# Patient Record
Sex: Male | Born: 1980 | Race: White | Hispanic: No | Marital: Married | Smoking: Current every day smoker
Health system: Southern US, Community
[De-identification: ages and names within clinical notes are randomized; demographics above are authoritative.]

## PROBLEM LIST (undated history)

## (undated) DIAGNOSIS — N4400 Torsion of testis, unspecified: Secondary | ICD-10-CM

## (undated) DIAGNOSIS — F1010 Alcohol abuse, uncomplicated: Secondary | ICD-10-CM

## (undated) HISTORY — PX: OTHER SURGICAL HISTORY: SHX169

## (undated) HISTORY — PX: SURGERY SCROTAL / TESTICULAR: SUR1316

---
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
## 2002-05-24 ENCOUNTER — Emergency Department (HOSPITAL_COMMUNITY): Admission: EM | Admit: 2002-05-24 | Discharge: 2002-05-24 | Payer: Self-pay | Admitting: Emergency Medicine

## 2002-05-24 ENCOUNTER — Encounter: Payer: Self-pay | Admitting: Emergency Medicine

## 2002-08-23 ENCOUNTER — Emergency Department (HOSPITAL_COMMUNITY): Admission: EM | Admit: 2002-08-23 | Discharge: 2002-08-23 | Payer: Self-pay | Admitting: Emergency Medicine

## 2002-09-03 ENCOUNTER — Emergency Department (HOSPITAL_COMMUNITY): Admission: EM | Admit: 2002-09-03 | Discharge: 2002-09-03 | Payer: Self-pay | Admitting: Emergency Medicine

## 2002-11-16 ENCOUNTER — Emergency Department (HOSPITAL_COMMUNITY): Admission: EM | Admit: 2002-11-16 | Discharge: 2002-11-16 | Payer: Self-pay | Admitting: Emergency Medicine

## 2002-11-16 ENCOUNTER — Encounter: Payer: Self-pay | Admitting: Emergency Medicine

## 2003-05-21 ENCOUNTER — Emergency Department (HOSPITAL_COMMUNITY): Admission: EM | Admit: 2003-05-21 | Discharge: 2003-05-21 | Payer: Self-pay | Admitting: Emergency Medicine

## 2003-06-03 ENCOUNTER — Emergency Department (HOSPITAL_COMMUNITY): Admission: EM | Admit: 2003-06-03 | Discharge: 2003-06-03 | Payer: Self-pay | Admitting: Emergency Medicine

## 2003-07-01 ENCOUNTER — Emergency Department (HOSPITAL_COMMUNITY): Admission: EM | Admit: 2003-07-01 | Discharge: 2003-07-02 | Payer: Self-pay | Admitting: Emergency Medicine

## 2003-08-29 ENCOUNTER — Emergency Department (HOSPITAL_COMMUNITY): Admission: EM | Admit: 2003-08-29 | Discharge: 2003-08-29 | Payer: Self-pay | Admitting: Emergency Medicine

## 2003-09-19 ENCOUNTER — Inpatient Hospital Stay (HOSPITAL_COMMUNITY): Admission: AC | Admit: 2003-09-19 | Discharge: 2003-09-21 | Payer: Self-pay

## 2003-09-27 ENCOUNTER — Emergency Department (HOSPITAL_COMMUNITY): Admission: EM | Admit: 2003-09-27 | Discharge: 2003-09-27 | Payer: Self-pay | Admitting: Emergency Medicine

## 2003-10-08 ENCOUNTER — Emergency Department (HOSPITAL_COMMUNITY): Admission: EM | Admit: 2003-10-08 | Discharge: 2003-10-08 | Payer: Self-pay | Admitting: Emergency Medicine

## 2004-01-08 ENCOUNTER — Emergency Department (HOSPITAL_COMMUNITY): Admission: EM | Admit: 2004-01-08 | Discharge: 2004-01-08 | Payer: Self-pay | Admitting: Emergency Medicine

## 2004-01-12 ENCOUNTER — Emergency Department (HOSPITAL_COMMUNITY): Admission: EM | Admit: 2004-01-12 | Discharge: 2004-01-12 | Payer: Self-pay | Admitting: Emergency Medicine

## 2004-02-23 ENCOUNTER — Emergency Department (HOSPITAL_COMMUNITY): Admission: EM | Admit: 2004-02-23 | Discharge: 2004-02-23 | Payer: Self-pay | Admitting: Emergency Medicine

## 2004-03-14 ENCOUNTER — Emergency Department (HOSPITAL_COMMUNITY): Admission: EM | Admit: 2004-03-14 | Discharge: 2004-03-14 | Payer: Self-pay | Admitting: Emergency Medicine

## 2004-06-03 ENCOUNTER — Emergency Department (HOSPITAL_COMMUNITY): Admission: EM | Admit: 2004-06-03 | Discharge: 2004-06-03 | Payer: Self-pay | Admitting: Emergency Medicine

## 2005-05-27 ENCOUNTER — Emergency Department (HOSPITAL_COMMUNITY): Admission: EM | Admit: 2005-05-27 | Discharge: 2005-05-28 | Payer: Self-pay | Admitting: Emergency Medicine

## 2005-05-30 ENCOUNTER — Emergency Department (HOSPITAL_COMMUNITY): Admission: EM | Admit: 2005-05-30 | Discharge: 2005-05-30 | Payer: Self-pay | Admitting: Emergency Medicine

## 2005-12-11 ENCOUNTER — Emergency Department (HOSPITAL_COMMUNITY): Admission: EM | Admit: 2005-12-11 | Discharge: 2005-12-12 | Payer: Self-pay | Admitting: Emergency Medicine

## 2007-04-13 ENCOUNTER — Emergency Department (HOSPITAL_COMMUNITY): Admission: EM | Admit: 2007-04-13 | Discharge: 2007-04-14 | Payer: Self-pay | Admitting: Emergency Medicine

## 2007-06-14 ENCOUNTER — Emergency Department (HOSPITAL_COMMUNITY): Admission: EM | Admit: 2007-06-14 | Discharge: 2007-06-14 | Payer: Self-pay | Admitting: Emergency Medicine

## 2007-07-17 ENCOUNTER — Emergency Department (HOSPITAL_COMMUNITY): Admission: EM | Admit: 2007-07-17 | Discharge: 2007-07-17 | Payer: Self-pay | Admitting: Emergency Medicine

## 2007-08-11 ENCOUNTER — Emergency Department (HOSPITAL_COMMUNITY): Admission: EM | Admit: 2007-08-11 | Discharge: 2007-08-11 | Payer: Self-pay | Admitting: Emergency Medicine

## 2007-09-06 ENCOUNTER — Emergency Department (HOSPITAL_COMMUNITY): Admission: EM | Admit: 2007-09-06 | Discharge: 2007-09-06 | Payer: Self-pay | Admitting: Emergency Medicine

## 2007-09-30 ENCOUNTER — Emergency Department (HOSPITAL_COMMUNITY): Admission: EM | Admit: 2007-09-30 | Discharge: 2007-09-30 | Payer: Self-pay | Admitting: Emergency Medicine

## 2007-10-26 ENCOUNTER — Emergency Department (HOSPITAL_COMMUNITY): Admission: EM | Admit: 2007-10-26 | Discharge: 2007-10-26 | Payer: Self-pay | Admitting: Emergency Medicine

## 2007-10-28 ENCOUNTER — Emergency Department (HOSPITAL_COMMUNITY): Admission: EM | Admit: 2007-10-28 | Discharge: 2007-10-29 | Payer: Self-pay | Admitting: Emergency Medicine

## 2007-11-11 ENCOUNTER — Emergency Department (HOSPITAL_COMMUNITY): Admission: EM | Admit: 2007-11-11 | Discharge: 2007-11-11 | Payer: Self-pay | Admitting: Radiation Oncology

## 2008-05-20 ENCOUNTER — Emergency Department (HOSPITAL_COMMUNITY): Admission: EM | Admit: 2008-05-20 | Discharge: 2008-05-20 | Payer: Self-pay | Admitting: Emergency Medicine

## 2008-05-27 ENCOUNTER — Emergency Department (HOSPITAL_COMMUNITY): Admission: EM | Admit: 2008-05-27 | Discharge: 2008-05-27 | Payer: Self-pay | Admitting: Emergency Medicine

## 2008-06-01 ENCOUNTER — Emergency Department (HOSPITAL_COMMUNITY): Admission: EM | Admit: 2008-06-01 | Discharge: 2008-06-01 | Payer: Self-pay | Admitting: Emergency Medicine

## 2009-11-14 IMAGING — CT CT PELVIS W/O CM
1 of 2 series · 13 of 32 positions shown, 19 images · non-contrast
Comparison: 07/17/2007

CT ABDOMEN

CLINICAL DATA: Right flank pain.  Assess for renal stone disease.

CT ABDOMEN AND PELVIS WITHOUT CONTRAST
TECHNIQUE: Multidetector CT imaging of the abdomen and pelvis was
performed following the standard
protocol without intravenous contrast.

[Series 4: 130 stone 4.0 b70f st · axial · 0.60mm/px · z∈[-631,-256]mm · 13 of 87 slices shown, 19 images]
[im 6/87  soft-tissue]
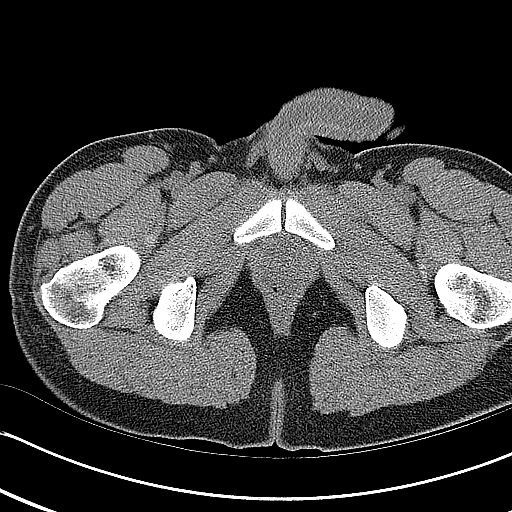
[im 6/87  bone]
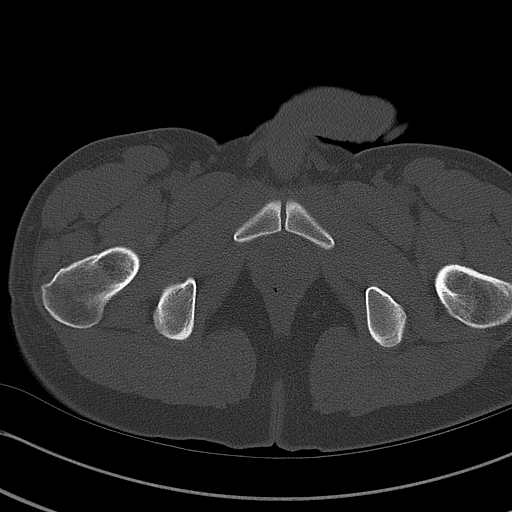
[im 12/87  soft-tissue]
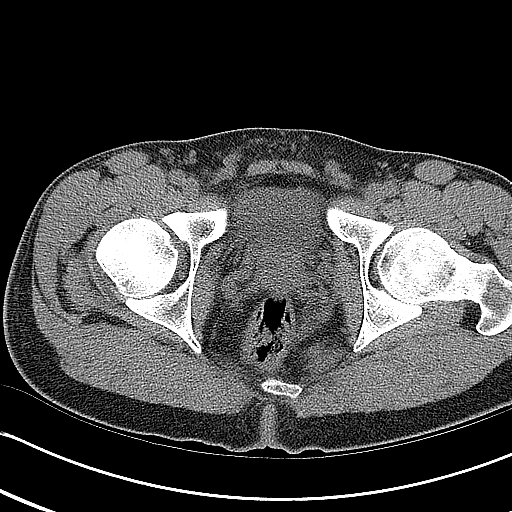
[im 18/87  soft-tissue]
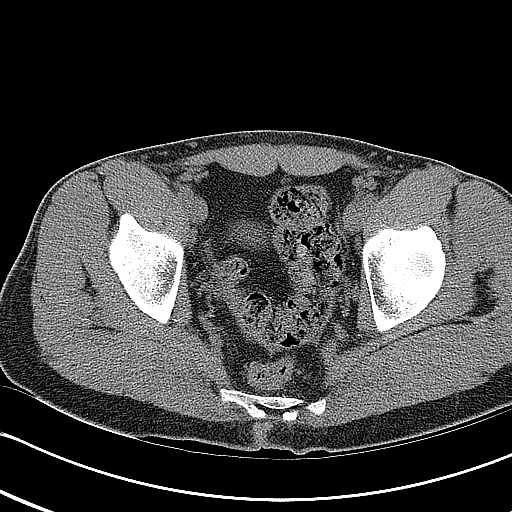
[im 23/87  soft-tissue]
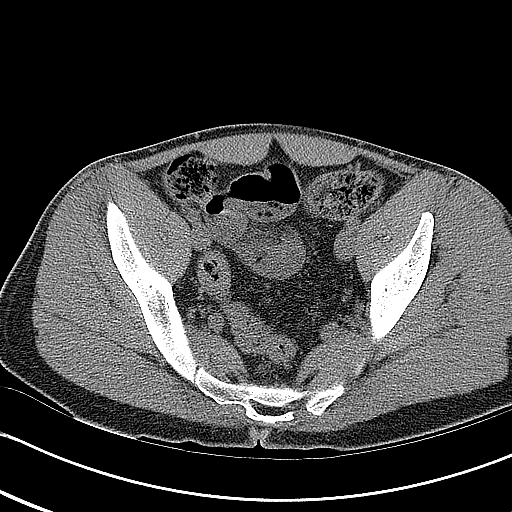
[im 29/87  soft-tissue]
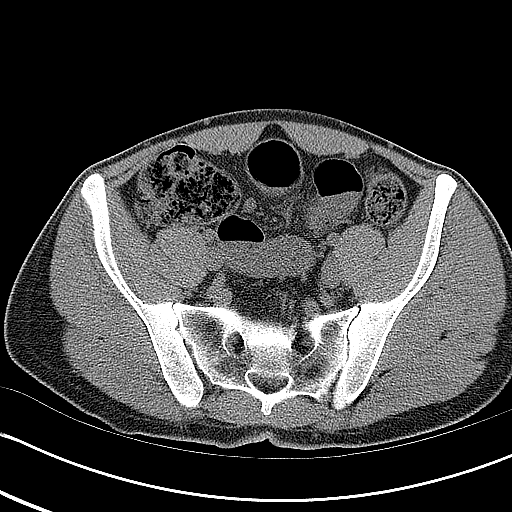
[im 35/87  soft-tissue]
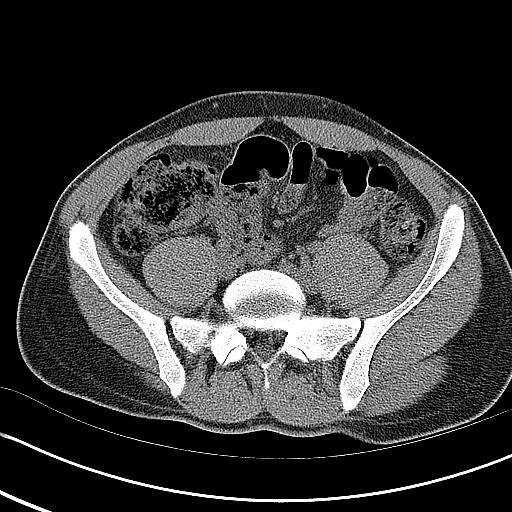
[im 46/87  soft-tissue]
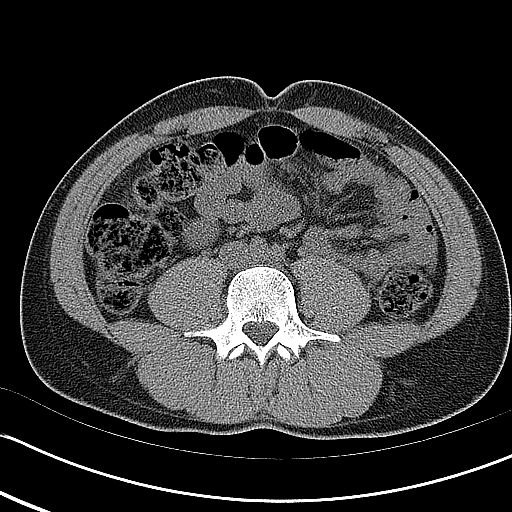
[im 52/87  soft-tissue]
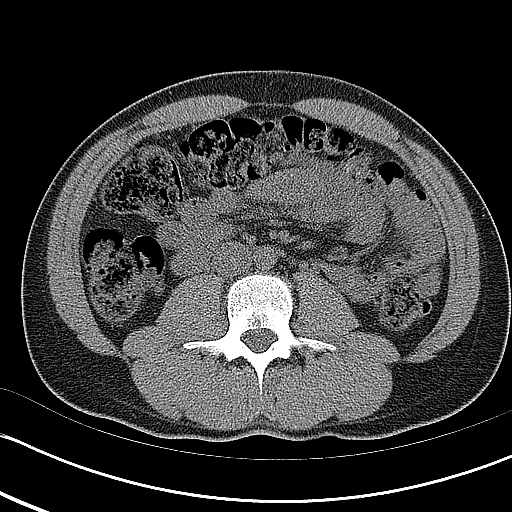
[im 58/87  soft-tissue]
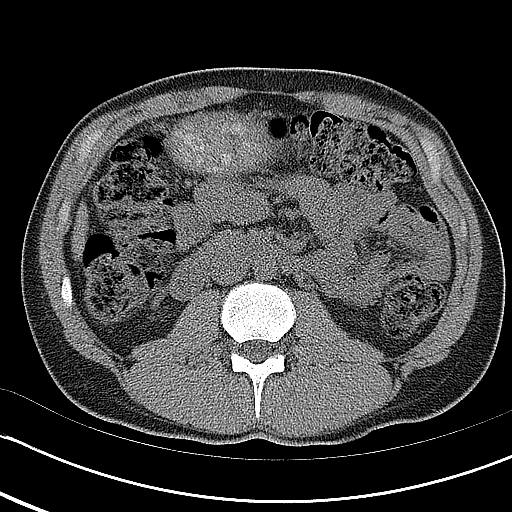
[im 58/87  bone]
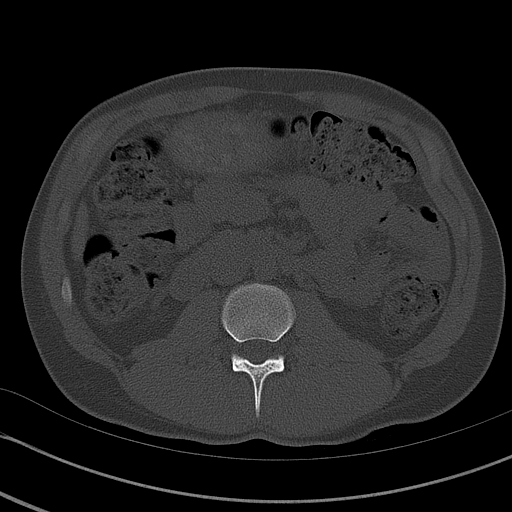
[im 64/87  soft-tissue]
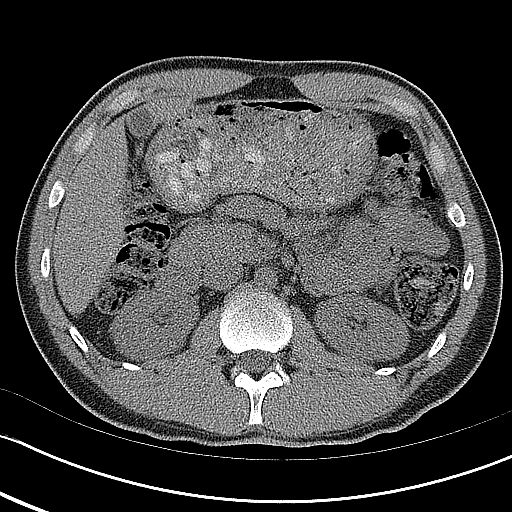
[im 64/87  lung]
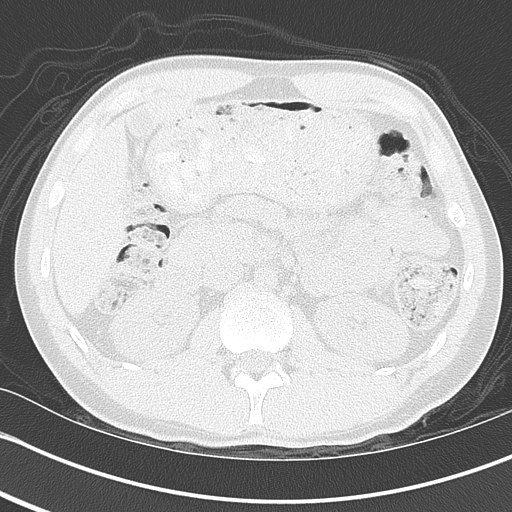
[im 69/87  soft-tissue]
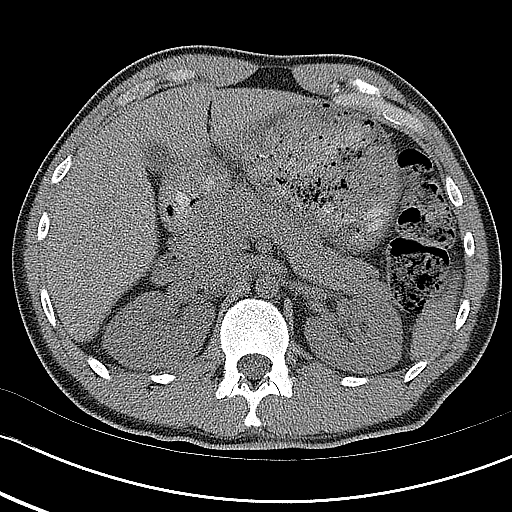
[im 69/87  lung]
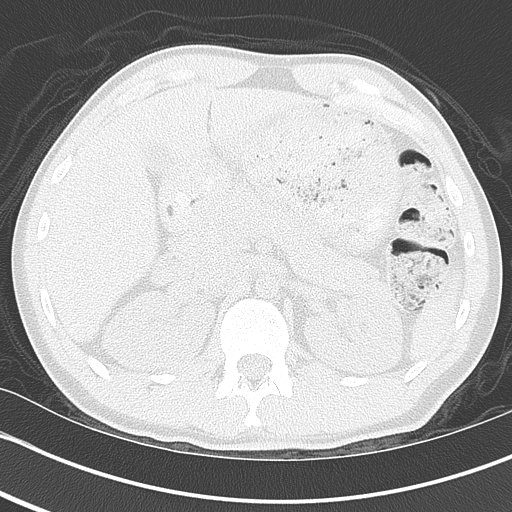
[im 75/87  soft-tissue]
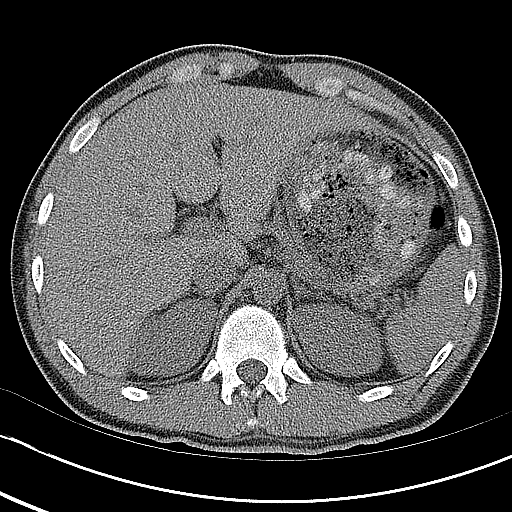
[im 75/87  lung]
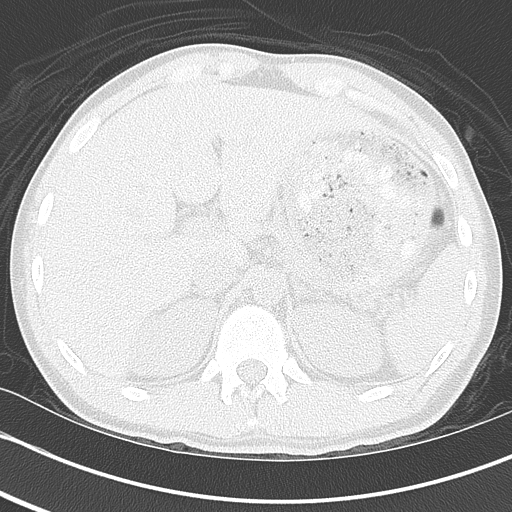
[im 81/87  soft-tissue]
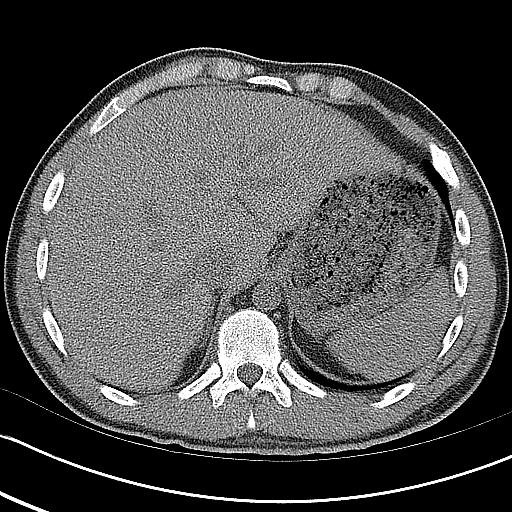
[im 81/87  lung]
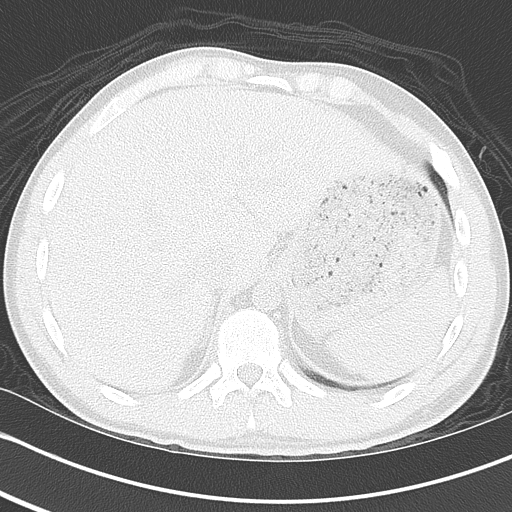

[13 of 32 positions shown; findings below may reference images not displayed]

FINDINGS: The liver has a none remarkable appearance without
contrast.  No calcified gallstones.  The spleen is normal.  The
pancreas is normal.  The adrenal glands are normal.  The kidneys
are normal in size shape and position.  No evidence of stone
disease or other pathology.  The aorta and IVC are normal.  No
retroperitoneal mass or adenopathy.  No free intraperitoneal fluid
or air.  There is a large amount of fecal matter in the colon.  No
focal bowel pathology.
IMPRESSION: No evidence of renal stone disease.

Large amount of fecal matter in the colon.

CT PELVIS
FINDINGS: No sign of urinary tract stone disease.  The bladder,
prostate gland and seminal vesicles appear unremarkable.  No mass
or adenopathy.  The appendix appears normal.  Large amount of fecal
matter is noted within the colon.
IMPRESSION: Negative except for large amount of fecal matter within the colon

## 2010-07-22 LAB — HEPATIC FUNCTION PANEL
Bilirubin, Direct: 0.3 mg/dL (ref 0.0–0.3)
Indirect Bilirubin: 0.7 mg/dL (ref 0.3–0.9)

## 2010-07-22 LAB — BASIC METABOLIC PANEL
BUN: 12 mg/dL (ref 6–23)
CO2: 27 mEq/L (ref 19–32)
Calcium: 9.8 mg/dL (ref 8.4–10.5)
Chloride: 102 mEq/L (ref 96–112)
Creatinine, Ser: 0.97 mg/dL (ref 0.4–1.5)
GFR calc Af Amer: >60 mL/min (ref >60)
GFR calc non Af Amer: >60 mL/min (ref >60)
GFR calc non Af Amer: >60 mL/min (ref >60)
Glucose, Bld: 106 mg/dL — ABNORMAL HIGH (ref 70–99)
Potassium: 3.9 mEq/L (ref 3.5–5.1)

## 2010-07-22 LAB — RAPID URINE DRUG SCREEN, HOSP PERFORMED
Amphetamines: NOT DETECTED
Amphetamines: NOT DETECTED
Barbiturates: NOT DETECTED
Benzodiazepines: NOT DETECTED
Benzodiazepines: POSITIVE — AB
Cocaine: POSITIVE — AB
Opiates: NOT DETECTED
Tetrahydrocannabinol: NOT DETECTED
Tetrahydrocannabinol: NOT DETECTED

## 2010-07-22 LAB — CBC
HCT: 43.1 % (ref 39.0–52.0)
MCHC: 35.1 g/dL (ref 30.0–36.0)
MCV: 93.2 fL (ref 78.0–100.0)
Platelets: 228 10*3/uL (ref 150–400)
RBC: 5.27 MIL/uL (ref 4.22–5.81)
RBC: 4.61 MIL/uL (ref 4.22–5.81)
RDW: 13.9 % (ref 11.5–15.5)
WBC: 10.1 10*3/uL (ref 4.0–10.5)
WBC: 6.2 10*3/uL (ref 4.0–10.5)

## 2010-07-22 LAB — DIFFERENTIAL
Basophils Relative: 1 % (ref 0–1)
Eosinophils Absolute: 0 10*3/uL (ref 0.0–0.7)
Eosinophils Relative: 1 % (ref 0–5)
Lymphocytes Relative: 22 % (ref 12–46)
Lymphs Abs: 1.4 10*3/uL (ref 0.7–4.0)
Monocytes Relative: 7 % (ref 3–12)
Neutro Abs: 7.1 10*3/uL (ref 1.7–7.7)
Neutrophils Relative %: 70 % (ref 43–77)

## 2010-07-22 LAB — COMPREHENSIVE METABOLIC PANEL
ALT: 39 U/L (ref 0–53)
AST: 62 U/L — ABNORMAL HIGH (ref 0–37)
CO2: 28 mEq/L (ref 19–32)
Calcium: 9.6 mg/dL (ref 8.4–10.5)
Chloride: 105 mEq/L (ref 96–112)
GFR calc Af Amer: >60 mL/min (ref >60)
GFR calc non Af Amer: >60 mL/min (ref >60)
Sodium: 139 mEq/L (ref 135–145)
Total Bilirubin: 0.7 mg/dL (ref 0.3–1.2)

## 2010-07-22 LAB — ETHANOL: Alcohol, Ethyl (B): 54 mg/dL — ABNORMAL HIGH (ref 0–10)
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
## 2010-08-22 NOTE — Op Note (Signed)
NAMETRUTH, BAROT                              ACCOUNT NO.:  [id number]   MEDICAL RECORD NO.:  [id number]                   PATIENT TYPE:  INP   LOCATION:  6705                                 FACILITY:  [facility]   PHYSICIAN:  Jimmye Norman III, M.D.               DATE OF BIRTH:  28-Jul-1980   DATE OF PROCEDURE:  09/19/2003  DATE OF DISCHARGE:                                 OPERATIVE REPORT   PREOPERATIVE DIAGNOSES:  Laceration of right forearm with near  exsanguination and cardiac arrest.   POSTOPERATIVE DIAGNOSIS:  Laceration of right forearm into muscle flexor  compartments of the right upper extremity below the right antecubital fossa.   PROCEDURE:  Exploration, control of hemorrhage, and repair of laceration.   SURGEON:  Jimmye Norman, M.D.   ASSISTANT:  __________, P.A. for trauma.   ANESTHESIA:  General endotracheal.   TOURNIQUET TIME:  Six minutes used intraoperatively.   COMPLICATIONS:  None.   CONDITION:  Stable.   FINDINGS:  The patient had lacerations deeply into the ulnar and radial-  sided muscle compartments in the right forearm with no obvious neurovascular  injury.  Attempt to explore for tendon injury was not done, but we did  control bleeding and repair the muscle bellies themselves.   OPERATION:  The patient was taken emergently up to the operating room for  repair after he nearly arrested from an exsanguinating hemorrhage from this  right upper extremity laceration.  Direct control of bleeding was not  attained in the ED.  The patient lost a significant amount requiring  intubation and blood transfusion.   He was taken to the operating room and placed on the table in the supine  position with an armboard, and the table was rotated.  We used a short cuff  above the antecubital fossa up near the axilla, inflated that up to 250  mmHg, and exsanguinated the hand prior to doing so.  We then unveiled the  area and prepped and draped in the usual sterile  manner.   We used an extremity drape and explored the lacerations on the ulnar and the  radial aspects of the forearm just below the antecubital fossa.  Each  laceration measured approximately 3 inches Luepke and were deep, about an inch  into the muscle compartments.   We irrigated with saline, removed all clot from the wounds prior to removing  the tourniquet.  Once we had it adequately cleaned out, we removed the  tourniquet and found that there was minimal bleeding from any significant  arterial structures, but there was a vein along the ulnar aspect of the  forearm laceration which had to be ligated with 3-0 Vicryl suture.   Once we had ligated this vein, there was very minimal arterial bleeding, but  some from the fascial edges of the cut muscle which could be cauterized and  controlled.  There were  no large neurovascular bundles noted to be lacerated  and hanging in the wound, and a deep exploration for these structures was  not performed.  We controlled the bleeding, then repaired it in three  layers, the initial one being repairing the muscle fascia while the arm was  flexed at the elbow using interrupted 2-0 Vicryl sutures.  We did not flex  it all to 90 degrees but at approximately 45 degrees or so to take the  tension off the muscle fascia repair.  Laterally, the fascia was also  repaired in a similar manner with interrupted 2-0 Vicryl suture, and again  no significant neurovascular bundles were noted to be lacerated or torn.  Once the muscle bellies were reapproximated, we used 3-0 Vicryl to  reapproximate the subcutaneous tissue laterally or along the radial aspect  and medially along the ulnar aspect.  Once the subcutaneous tissue was  closed, we used staples to close the skin with a few interrupted 4-0 nylon  sutures used to close the smaller lacerated areas.  Once this was done, we  provided sterile dressing and put on a posterior splint with the patient  flexed at the  elbow at approximately 60 degrees.  Sterile dressing was  applied including a Betadine ointment, 4 x4's, and a Kerlix, followed by  Webril and a splint.  The patient had an excellent radial pulse after the  procedure and also once the tourniquet was released which was approximately  six minutes after it had been inflated.  He was in stable condition, and he  was taken to the recovery room in stable condition where further assessment  of hand function will be obtained.                                               Kathrin Ruddy, M.D.    JW/MEDQ  D:  09/19/2003  T:  09/19/2003  Job:  [id number]
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
## 2010-12-29 LAB — COMPREHENSIVE METABOLIC PANEL
ALT: 18
AST: 27
Alkaline Phosphatase: 53
CO2: 22
Chloride: 100
Creatinine, Ser: 0.83
GFR calc Af Amer: >60
GFR calc non Af Amer: >60
Potassium: 3.8
Sodium: 133 — ABNORMAL LOW
Total Bilirubin: 0.8

## 2010-12-29 LAB — DIFFERENTIAL
Basophils Absolute: 0
Basophils Relative: 0
Eosinophils Absolute: 0
Eosinophils Relative: 0
Monocytes Absolute: 0.4

## 2010-12-29 LAB — TRICYCLICS SCREEN, URINE: TCA Scrn: NOT DETECTED

## 2010-12-29 LAB — URINALYSIS, ROUTINE W REFLEX MICROSCOPIC
Bilirubin Urine: NEGATIVE
Ketones, ur: NEGATIVE
Nitrite: NEGATIVE
Protein, ur: NEGATIVE

## 2010-12-29 LAB — RAPID URINE DRUG SCREEN, HOSP PERFORMED
Benzodiazepines: NOT DETECTED
Tetrahydrocannabinol: NOT DETECTED

## 2010-12-29 LAB — ETHANOL: Alcohol, Ethyl (B): 84 — ABNORMAL HIGH

## 2010-12-29 LAB — CBC
MCV: 91
RBC: 4.91
WBC: 7.9

## 2011-01-01 LAB — CBC
HCT: 40.9
HCT: 45.8
Hemoglobin: 14.2
MCHC: 34.8
Platelets: 173
Platelets: 223
RDW: 14.1
RDW: 13.6

## 2011-01-01 LAB — URINALYSIS, ROUTINE W REFLEX MICROSCOPIC
Bilirubin Urine: NEGATIVE
Bilirubin Urine: NEGATIVE
Hgb urine dipstick: NEGATIVE
Ketones, ur: NEGATIVE
Nitrite: NEGATIVE
Specific Gravity, Urine: 1.025
Specific Gravity, Urine: 1.021
Urobilinogen, UA: 0.2
pH: 5
pH: 6.5

## 2011-01-01 LAB — ETHANOL: Alcohol, Ethyl (B): 61 — ABNORMAL HIGH

## 2011-01-01 LAB — COMPREHENSIVE METABOLIC PANEL
ALT: 16
Albumin: 4.1
Calcium: 8.8
Glucose, Bld: 106 — ABNORMAL HIGH
Sodium: 139
Total Protein: 6.9

## 2011-01-01 LAB — ACETAMINOPHEN LEVEL: Acetaminophen (Tylenol), Serum: <10 — ABNORMAL LOW

## 2011-01-01 LAB — SALICYLATE LEVEL: Salicylate Lvl: <4

## 2011-01-01 LAB — DIFFERENTIAL
Basophils Absolute: 0
Basophils Absolute: 0
Basophils Relative: 1
Eosinophils Absolute: 0.1
Eosinophils Relative: 2
Eosinophils Relative: 0
Lymphocytes Relative: 15
Monocytes Absolute: 0.3
Neutro Abs: 6.2
Neutrophils Relative %: 77

## 2011-01-01 LAB — BASIC METABOLIC PANEL
BUN: 10
CO2: 25
Glucose, Bld: 102 — ABNORMAL HIGH
Potassium: 4.3
Sodium: 139

## 2011-01-01 LAB — RAPID URINE DRUG SCREEN, HOSP PERFORMED
Cocaine: POSITIVE — AB
Opiates: NOT DETECTED

## 2011-01-02 LAB — CBC
HCT: 43.5
Hemoglobin: 15.1
Platelets: 185
WBC: 9.7

## 2011-01-02 LAB — DIFFERENTIAL
Eosinophils Relative: 0
Lymphocytes Relative: 20
Lymphs Abs: 1.9

## 2011-01-02 LAB — URINALYSIS, ROUTINE W REFLEX MICROSCOPIC
Bilirubin Urine: NEGATIVE
Hgb urine dipstick: NEGATIVE
Specific Gravity, Urine: 1.024
pH: 6.5

## 2011-01-02 LAB — POCT I-STAT, CHEM 8
BUN: 15
Chloride: 104
Creatinine, Ser: 1.2
Sodium: 141
TCO2: 30

## 2011-01-02 LAB — RAPID URINE DRUG SCREEN, HOSP PERFORMED: Benzodiazepines: POSITIVE — AB

## 2011-01-02 LAB — URINE MICROSCOPIC-ADD ON

## 2011-01-02 LAB — ETHANOL: Alcohol, Ethyl (B): <5

## 2014-11-13 ENCOUNTER — Encounter (HOSPITAL_COMMUNITY): Payer: Self-pay | Admitting: Emergency Medicine

## 2014-11-13 ENCOUNTER — Emergency Department (HOSPITAL_COMMUNITY)
Admission: EM | Admit: 2014-11-13 | Discharge: 2014-11-16 | Disposition: A | Discharge status: Home / Self Care | Payer: Self-pay | Attending: Emergency Medicine | Admitting: Emergency Medicine

## 2014-11-13 ENCOUNTER — Emergency Department (HOSPITAL_COMMUNITY): Payer: Self-pay

## 2014-11-13 DIAGNOSIS — M79641 Pain in right hand: Secondary | ICD-10-CM

## 2014-11-13 DIAGNOSIS — Z720 Tobacco use: Secondary | ICD-10-CM | POA: Insufficient documentation

## 2014-11-13 DIAGNOSIS — Y9289 Other specified places as the place of occurrence of the external cause: Secondary | ICD-10-CM | POA: Insufficient documentation

## 2014-11-13 DIAGNOSIS — F1994 Other psychoactive substance use, unspecified with psychoactive substance-induced mood disorder: Secondary | ICD-10-CM | POA: Diagnosis present

## 2014-11-13 DIAGNOSIS — S79911A Unspecified injury of right hip, initial encounter: Secondary | ICD-10-CM | POA: Insufficient documentation

## 2014-11-13 DIAGNOSIS — F1490 Cocaine use, unspecified, uncomplicated: Secondary | ICD-10-CM | POA: Diagnosis present

## 2014-11-13 DIAGNOSIS — R45850 Homicidal ideations: Secondary | ICD-10-CM

## 2014-11-13 DIAGNOSIS — T07XXXA Unspecified multiple injuries, initial encounter: Secondary | ICD-10-CM

## 2014-11-13 DIAGNOSIS — S8992XA Unspecified injury of left lower leg, initial encounter: Secondary | ICD-10-CM | POA: Insufficient documentation

## 2014-11-13 DIAGNOSIS — M25562 Pain in left knee: Secondary | ICD-10-CM

## 2014-11-13 DIAGNOSIS — R451 Restlessness and agitation: Secondary | ICD-10-CM | POA: Insufficient documentation

## 2014-11-13 DIAGNOSIS — F1020 Alcohol dependence, uncomplicated: Secondary | ICD-10-CM | POA: Diagnosis present

## 2014-11-13 DIAGNOSIS — M25551 Pain in right hip: Secondary | ICD-10-CM

## 2014-11-13 DIAGNOSIS — S6991XA Unspecified injury of right wrist, hand and finger(s), initial encounter: Secondary | ICD-10-CM | POA: Insufficient documentation

## 2014-11-13 DIAGNOSIS — Y9389 Activity, other specified: Secondary | ICD-10-CM | POA: Insufficient documentation

## 2014-11-13 DIAGNOSIS — Y998 Other external cause status: Secondary | ICD-10-CM | POA: Insufficient documentation

## 2014-11-13 DIAGNOSIS — F39 Unspecified mood [affective] disorder: Secondary | ICD-10-CM | POA: Diagnosis present

## 2014-11-13 LAB — CBC WITH DIFFERENTIAL/PLATELET
BASOS ABS: 0 10*3/uL (ref 0.0–0.1)
Basophils Relative: 1 % (ref 0–1)
EOS PCT: 0 % (ref 0–5)
Eosinophils Absolute: 0 10*3/uL (ref 0.0–0.7)
HEMATOCRIT: 43.3 % (ref 39.0–52.0)
Hemoglobin: 14.4 g/dL (ref 13.0–17.0)
Lymphocytes Relative: 24 % (ref 12–46)
Lymphs Abs: 1.5 10*3/uL (ref 0.7–4.0)
MCH: 30.1 pg (ref 26.0–34.0)
MCHC: 33.3 g/dL (ref 30.0–36.0)
MCV: 90.4 fL (ref 78.0–100.0)
Monocytes Absolute: 0.4 10*3/uL (ref 0.1–1.0)
Monocytes Relative: 7 % (ref 3–12)
NEUTROS PCT: 68 % (ref 43–77)
Neutro Abs: 4.1 10*3/uL (ref 1.7–7.7)
Platelets: 180 10*3/uL (ref 150–400)
RBC: 4.79 MIL/uL (ref 4.22–5.81)
RDW: 15.8 % — AB (ref 11.5–15.5)
WBC: 6 10*3/uL (ref 4.0–10.5)

## 2014-11-13 LAB — COMPREHENSIVE METABOLIC PANEL
ALBUMIN: 4.5 g/dL (ref 3.5–5.0)
ALT: 23 U/L (ref 17–63)
AST: 44 U/L — ABNORMAL HIGH (ref 15–41)
Alkaline Phosphatase: 56 U/L (ref 38–126)
Anion gap: 13 (ref 5–15)
BUN: 11 mg/dL (ref 6–20)
CO2: 25 mmol/L (ref 22–32)
Calcium: 9.1 mg/dL (ref 8.9–10.3)
Chloride: 105 mmol/L (ref 101–111)
Creatinine, Ser: 1.23 mg/dL (ref 0.61–1.24)
GFR calc non Af Amer: >60 mL/min (ref >60)
Glucose, Bld: 104 mg/dL — ABNORMAL HIGH (ref 65–99)
Potassium: 3.9 mmol/L (ref 3.5–5.1)
Sodium: 143 mmol/L (ref 135–145)
TOTAL PROTEIN: 7.7 g/dL (ref 6.5–8.1)
Total Bilirubin: 0.1 mg/dL — ABNORMAL LOW (ref 0.3–1.2)

## 2014-11-13 LAB — ETHANOL: ALCOHOL ETHYL (B): 160 mg/dL — AB (ref <5)

## 2014-11-13 LAB — RAPID URINE DRUG SCREEN, HOSP PERFORMED
AMPHETAMINES: NOT DETECTED
BARBITURATES: NOT DETECTED
Benzodiazepines: POSITIVE — AB
Cocaine: POSITIVE — AB
OPIATES: NOT DETECTED
Tetrahydrocannabinol: NOT DETECTED

## 2014-11-13 MED ORDER — ONDANSETRON HCL 4 MG PO TABS: 4.0000 mg | ORAL_TABLET | Freq: Three times a day (TID) | ORAL | Status: DC | PRN

## 2014-11-13 MED ORDER — NICOTINE 21 MG/24HR TD PT24
21.0000 mg | MEDICATED_PATCH | Freq: Once | TRANSDERMAL | Status: AC
  Administered 2014-11-13: 21 mg via TRANSDERMAL
  Filled 2014-11-13: qty 1

## 2014-11-13 MED ORDER — ZOLPIDEM TARTRATE 5 MG PO TABS: 5.0000 mg | ORAL_TABLET | Freq: Every evening | ORAL | Status: DC | PRN

## 2014-11-13 MED ORDER — IBUPROFEN 200 MG PO TABS: 600.0000 mg | ORAL_TABLET | Freq: Three times a day (TID) | ORAL | Status: DC | PRN

## 2014-11-13 MED ORDER — TRAMADOL HCL 50 MG PO TABS
50.0000 mg | ORAL_TABLET | Freq: Once | ORAL | Status: AC
  Administered 2014-11-13: 50 mg via ORAL
  Filled 2014-11-13: qty 1
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
## 2014-11-13 NOTE — [facility] Counselor (Signed)
Pt referred to: [facility] [address]   [place], [place] OBS Counselor
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
## 2014-11-13 NOTE — BH Assessment (Signed)
[facility] Assessment Progress Note   Called and scheduled this pt's tele assessment with this clinician.  Called [name], PA-C to gather clinical information on the pt.  Casimer Lanius, MS, [facility] Therapeutic Triage Specialist [facility]

## 2014-11-13 NOTE — ED Notes (Signed)
Patient has one bag of belongings in locker 27. 

## 2014-11-13 NOTE — ED Notes (Signed)
Pt will not admit whom his is homicidal toward.

## 2014-11-14 DIAGNOSIS — F1020 Alcohol dependence, uncomplicated: Secondary | ICD-10-CM | POA: Diagnosis present

## 2014-11-14 DIAGNOSIS — F39 Unspecified mood [affective] disorder: Secondary | ICD-10-CM | POA: Diagnosis present

## 2014-11-14 DIAGNOSIS — F1490 Cocaine use, unspecified, uncomplicated: Secondary | ICD-10-CM | POA: Diagnosis present

## 2014-11-14 DIAGNOSIS — R45850 Homicidal ideations: Secondary | ICD-10-CM | POA: Insufficient documentation

## 2014-11-14 DIAGNOSIS — R451 Restlessness and agitation: Secondary | ICD-10-CM | POA: Insufficient documentation

## 2014-11-14 DIAGNOSIS — F1420 Cocaine dependence, uncomplicated: Secondary | ICD-10-CM

## 2014-11-14 MED ORDER — LORAZEPAM 1 MG PO TABS
1.0000 mg | ORAL_TABLET | Freq: Four times a day (QID) | ORAL | Status: DC | PRN
  Administered 2014-11-14 – 2014-11-16 (×2): 1 mg via ORAL
  Filled 2014-11-14 (×2): qty 1

## 2014-11-14 MED ORDER — HYDROXYZINE HCL 25 MG PO TABS
25.0000 mg | ORAL_TABLET | Freq: Three times a day (TID) | ORAL | Status: DC | PRN
  Administered 2014-11-14 – 2014-11-15 (×3): 25 mg via ORAL
  Filled 2014-11-14 (×2): qty 1

## 2014-11-14 MED ORDER — HALOPERIDOL 5 MG PO TABS
5.0000 mg | ORAL_TABLET | Freq: Once | ORAL | Status: AC
  Administered 2014-11-14: 5 mg via ORAL
  Filled 2014-11-14: qty 1

## 2014-11-14 MED ORDER — LORAZEPAM 1 MG PO TABS
2.0000 mg | ORAL_TABLET | Freq: Once | ORAL | Status: AC
  Administered 2014-11-14: 2 mg via ORAL
  Filled 2014-11-14: qty 2

## 2014-11-14 MED ORDER — QUETIAPINE FUMARATE 100 MG PO TABS
100.0000 mg | ORAL_TABLET | Freq: Every day | ORAL | Status: DC
  Administered 2014-11-14 – 2014-11-15 (×2): 100 mg via ORAL
  Filled 2014-11-14 (×2): qty 1

## 2014-11-14 MED ORDER — FLUOXETINE HCL 10 MG PO CAPS
10.0000 mg | ORAL_CAPSULE | Freq: Every day | ORAL | Status: DC
  Administered 2014-11-14 – 2014-11-16 (×3): 10 mg via ORAL
  Filled 2014-11-14 (×4): qty 1
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
## 2014-11-14 NOTE — ED Notes (Signed)
Brad Sievert, PA called and informed that patient is pacing, rocking and reporting that he hears voices and appears anxious at this time. Patient stated " I want something to take to make the voices go away". Telephone orders received for haldol 5 mg PO and ativan 2 mg po given verified and read back.

## 2014-11-14 NOTE — ED Notes (Signed)
Patient was witnessed on camera by writer shaking and pacing. Writer went and asked him if he was having anxiety he said he is hearing voices and he needs something to make them go away

## 2014-11-14 NOTE — Progress Notes (Signed)
Followed up on inpatient placement efforts.  Referred to: [facility]- per [place], did not receive referral last night- refax  [facility]- per Britta Mccre[facility]- per Victorino Dike  Declined: [facility] due to [facility]- per Delice Bison as pt is uninsured and would require 4,000+ deposit up-front  At capacity: [place] for [facility] beds [facility]  Ilean Skill, MSW, LCSW Clinical Social Work, Disposition  11/14/2014 [phone]

## 2014-11-14 NOTE — ED Notes (Signed)
Patient denies SI and VH at this time. Patient admits HI to people he owes drug money too and  AH. Patient calm and cooperative at this time. Plan of care discussed with patient. Patient voices no complaints at this time. Encouragement and support provided and safety maintain. Q 15 min safety checks remain in place.

## 2014-11-15 MED ORDER — QUETIAPINE FUMARATE 50 MG PO TABS
50.0000 mg | ORAL_TABLET | Freq: Two times a day (BID) | ORAL | Status: DC
  Administered 2014-11-15: 50 mg via ORAL
  Filled 2014-11-15: qty 1

## 2014-11-15 MED ORDER — QUETIAPINE FUMARATE 50 MG PO TABS
50.0000 mg | ORAL_TABLET | Freq: Two times a day (BID) | ORAL | Status: DC
  Administered 2014-11-16: 50 mg via ORAL
  Filled 2014-11-15: qty 1

## 2014-11-15 NOTE — Progress Notes (Signed)
Pt seen on 8/10/ 16 and 11/15/14 Cm noted pt to be sleepy on 11/14/14  CM spoke with pt who confirms uninsured [employer] resident with no pcp.  CM discussed and provided written information for uninsured accepting pcps, discussed the importance of pcp vs EDP services for f/u care, www.needymeds.org, www.goodrx.com, discounted pharmacies and other [employer] such as [facility] , [employer], affordable care act, financial assistance, uninsured dental services, [place] med assist, DSS and  health department  Reviewed resources for [employer] uninsured accepting pcps like [facility], family medicine at [facility], [facility], [facility], local urgent care centers, [facility], [facility], general medical clinics, family services of the [place], [facility] urgent care plus others, medication resources, [facility] out patient [facility]ies and housing Pt voiced understanding and appreciation of resources provided   Provided P4CC contact information Pt agreed to a referral Cm completed referral Pt to be contact by [facility] clinical liason

## 2014-11-15 NOTE — BH Assessment (Signed)
BHH Assessment Progress Note  The following facilities have been contacted to seek placement for this pt, with results as noted:  Beds available, information sent, decision pending:  [facility]  At capacity:  [facility]  Messages left, awaiting call back:  [facility]   Steve Youngberg, MA Triage Specialist [phone]     

## 2014-11-15 NOTE — ED Notes (Signed)
Patient awake in his room most of the day.  Complained of anxiety and homicidal ideations off and on.  He received hydroxyzine once with some relief.  He was also started on 50 mg quetiapine during the day.  He states that was helpful and he actually rested for a while after that.  States he has been tried on multiple medications throughout the years and quetiapine has always helped him.  His appetite is very good and he even asks for a second tray after most meals.

## 2014-11-15 NOTE — BHH Counselor (Signed)
Pending review for possible placement with [facility].  

## 2014-11-15 NOTE — Progress Notes (Signed)
CSW was notified by [facility] that they are missing the clinical screening document. CSW faxed the facility the requested document.   Trish Mage [phone] ED CSW 11/15/2014 5:20 PM
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
## 2014-11-15 NOTE — ED Notes (Signed)
Patient admits to Hi towards noisy patients in the hallway and AH tell him to hurt noisy people. Patient denies SI and VH at this time. Plan of care discussed with patient. Patient voices no complaints or concerns at this time. Encouragement and support provided and safety maintain. Q 15 min safety checks remain in place.

## 2014-11-15 NOTE — Consult Note (Signed)
Psychiatric Specialty Exam: Physical Exam  ROS  Blood pressure 130/70, pulse 74, temperature 98 F (36.7 C), temperature source Oral, resp. rate 18, SpO2 98 %.There is no height or weight on file to calculate BMI.  General Appearance: Casual  Eye Contact:: Good  Speech: Clear and Coherent and Normal Rate  Volume: Normal  Mood: Angry and Anxious  Affect: Congruent, Depressed and Flat  Thought Process: Coherent, Goal Directed and Intact  Orientation: Full (Time, Place, and Person)  Thought Content: WDL  Suicidal Thoughts: No  Homicidal Thoughts: Yes. with intent/plan  Memory: Immediate; Good Recent; Good Remote; Good  Judgement: Poor  Insight: Fair  Psychomotor Activity: Normal  Concentration: Good  Recall: NA  Fund of Knowledge:Fair  Language: Good  Akathisia: NA  Handed: Right  AIMS (if indicated):    Assets: Desire for Improvement  ADL's: Intact  Cognition: WNL  Sleep:             Patient is complaining of auditory hallucination with voices telling him to hurt People especially a patient chanting on the hallway.  Patient is isolative in his room, he has been medicated for anxiety.  He is waiting for an admission bed and reports feeling anxious.  He denies suicide today but reports feeling homicidal towards a noisy patient.  We will continue to monitor.  Alcohol use disorder, moderate, dependence   Plan:  Prescribe Seroquel 50 mg po bid in addition to 100 mg prescribed for bed time.  We will continue to seek inpatient hospitalization.  Dahlia Byes   PMHNP-BC Patient seen face-to-face for psychiatric evaluation, chart reviewed and case discussed with the physician extender and developed treatment plan. Reviewed the information documented and agree with the treatment plan. Thedore Mins, MD

## 2014-11-16 DIAGNOSIS — F1994 Other psychoactive substance use, unspecified with psychoactive substance-induced mood disorder: Secondary | ICD-10-CM | POA: Diagnosis present

## 2014-11-16 NOTE — BHH Suicide Risk Assessment (Signed)
Suicide Risk Assessment  Discharge Assessment   [facility] Discharge Suicide Risk Assessment   Demographic Factors:  Male and Adolescent or young adult  Total Time spent with patient: 30 minutes  Musculoskeletal: Strength & Muscle Tone: within normal limits Gait & Station: normal Patient leans: N/A  Psychiatric Specialty Exam: Physical Exam  Review of Systems  Constitutional: Negative.   HENT: Negative.   Eyes: Negative.   Respiratory: Negative.   Cardiovascular: Negative.   Gastrointestinal: Negative.   Genitourinary: Negative.   Musculoskeletal: Negative.   Skin: Negative.   Neurological: Negative.   Endo/Heme/Allergies: Negative.   Psychiatric/Behavioral: Positive for substance abuse. The patient is nervous/anxious.     Blood pressure 127/81, pulse 91, temperature 97.6 F (36.4 C), temperature source Oral, resp. rate 18, SpO2 98 %.There is no height or weight on file to calculate BMI.  General Appearance: Casual  Eye Contact::  Good  Speech:  Clear and Coherent and Normal Rate  Volume:  Normal  Mood:  Mild anxiety  Affect:  Congruent   Thought Process:  Coherent, Goal Directed and Intact  Orientation:  Full (Time, Place, and Person)  Thought Content:  WDL  Suicidal Thoughts:  No  Homicidal Thoughts:  No  Memory:  Immediate;   Good Recent;   Good Remote;   Good  Judgement:  Fair  Insight:  Fair  Psychomotor Activity:  Normal  Concentration:  Good  Recall:  NA  Fund of Knowledge:Fair  Language: Good  Akathisia:  NA  Handed:  Right  AIMS (if indicated):     Assets:  Desire for Improvement  ADL's:  Intact  Cognition: WNL  Sleep:         Has this patient used any form of tobacco in the last 30 days? (Cigarettes, Smokeless Tobacco, Cigars, and/or Pipes) Yes, A prescription for an FDA-approved tobacco cessation medication was offered at discharge and the patient refused  Mental Status Per Nursing Assessment::   On Admission:   Homicidal ideations  Current  Mental Status by Physician: NA  Loss Factors: NA  Historical Factors: NA  Risk Reduction Factors:   Positive social support  Continued Clinical Symptoms:  Mild anxiety, substance abuse  Cognitive Features That Contribute To Risk:  None    Suicide Risk:  Minimal: No identifiable suicidal ideation.  Patients presenting with no risk factors but with morbid ruminations; may be classified as minimal risk based on the severity of the depressive symptoms  Principal Problem: Substance induced mood disorder Discharge Diagnoses:  Patient Active Problem List   Diagnosis Date Noted  . Substance induced mood disorder [F19.94] 11/16/2014    Priority: High  . Alcohol use disorder, moderate, dependence [F10.20] 11/14/2014    Priority: High  . Cocaine use [F14.10] 11/14/2014    Priority: High  . Agitation [R45.1]   . Homicidal ideations [R45.850]     Follow-up Information    Call to follow up.   Contact information:   [facility]  [phone]      Schedule an appointment as soon as possible for a visit to follow up.   Contact information:   Floydene Flock  [phone]      Plan Of Care/Follow-up recommendations:  Activity:  as tolerated Diet:  heart healthy diet  Is patient on multiple antipsychotic therapies at discharge:  No   Has Patient had three or more failed trials of antipsychotic monotherapy by history:  No  Recommended Plan for Multiple Antipsychotic Therapies: NA    Velena Keegan, PMH-NP 11/16/2014, 1:32 PM
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
## 2014-12-04 ENCOUNTER — Emergency Department (HOSPITAL_COMMUNITY): Payer: Self-pay

## 2014-12-04 ENCOUNTER — Emergency Department (HOSPITAL_COMMUNITY)
Admission: EM | Admit: 2014-12-04 | Discharge: 2014-12-04 | Disposition: A | Discharge status: Home / Self Care | Payer: Self-pay | Attending: Emergency Medicine | Admitting: Emergency Medicine

## 2014-12-04 ENCOUNTER — Encounter (HOSPITAL_COMMUNITY): Payer: Self-pay | Admitting: Emergency Medicine

## 2014-12-04 DIAGNOSIS — Z720 Tobacco use: Secondary | ICD-10-CM | POA: Insufficient documentation

## 2014-12-04 DIAGNOSIS — W1839XA Other fall on same level, initial encounter: Secondary | ICD-10-CM | POA: Insufficient documentation

## 2014-12-04 DIAGNOSIS — M25511 Pain in right shoulder: Secondary | ICD-10-CM

## 2014-12-04 DIAGNOSIS — S4991XA Unspecified injury of right shoulder and upper arm, initial encounter: Secondary | ICD-10-CM | POA: Insufficient documentation

## 2014-12-04 DIAGNOSIS — F10120 Alcohol abuse with intoxication, uncomplicated: Secondary | ICD-10-CM | POA: Insufficient documentation

## 2014-12-04 DIAGNOSIS — Y998 Other external cause status: Secondary | ICD-10-CM | POA: Insufficient documentation

## 2014-12-04 DIAGNOSIS — Y9389 Activity, other specified: Secondary | ICD-10-CM | POA: Insufficient documentation

## 2014-12-04 DIAGNOSIS — F10920 Alcohol use, unspecified with intoxication, uncomplicated: Secondary | ICD-10-CM

## 2014-12-04 DIAGNOSIS — Y9289 Other specified places as the place of occurrence of the external cause: Secondary | ICD-10-CM | POA: Insufficient documentation

## 2014-12-04 MED ORDER — IBUPROFEN 800 MG PO TABS
800.0000 mg | ORAL_TABLET | Freq: Once | ORAL | Status: AC
  Administered 2014-12-04: 800 mg via ORAL
  Filled 2014-12-04: qty 1

## 2014-12-04 NOTE — ED Provider Notes (Signed)
CSN: [id number]     Arrival date & time 12/04/14  0140 History   First MD Initiated Contact with Patient 12/04/14 [phone]     Chief Complaint  Patient presents with  . Alcohol Intoxication  . Shoulder Pain    right     (Consider location/radiation/quality/duration/timing/severity/associated sxs/prior Treatment) HPI Comments: The patient arrives via EMS who report the patient complained of shoulder pain after fall where he felt he dislocated the shoulder and reduced it himself. He is significantly intoxicated with strong odor of alcohol, slurred speech and somnolence. He cannot contribute to history at the current time.  Patient is a 34 y.o. male presenting with intoxication and shoulder pain. The history is provided by the patient and the EMS personnel.  Alcohol Intoxication  Shoulder Pain   History reviewed. No pertinent past medical history. History reviewed. No pertinent past surgical history. History reviewed. No pertinent family history. Social History  Substance Use Topics  . Smoking status: Current Every Day Smoker    Types: Cigarettes  . Smokeless tobacco: None  . Alcohol Use: Yes    Review of Systems  Unable to perform ROS: Other      Allergies  Review of patient's allergies indicates no known allergies.  Home Medications   Prior to Admission medications   Not on File   BP 155/97 mmHg  Pulse 98  Temp(Src) 98.2 F (36.8 C) (Oral)  Resp 16  SpO2 95% Physical Exam  Constitutional: He appears well-developed and well-nourished. No distress.  HENT:  Head: Normocephalic and atraumatic.  Neck: Normal range of motion. Neck supple.  Cardiovascular: Normal rate and regular rhythm.   Pulmonary/Chest: Effort normal.  Abdominal: Soft. Bowel sounds are normal. There is no tenderness. There is no rebound and no guarding.  Musculoskeletal: Normal range of motion.  Right shoulder with deformity. He is moving right arm without signs of distress.   Neurological:  Wakes  to verbal stimuli. Speech incoherent.  Skin: Skin is warm and dry. No rash noted.  Psychiatric: He has a normal mood and affect.    ED Course  Procedures (including critical care time) Labs Review Labs Reviewed - No data to display  Imaging Review Dg Shoulder Right  12/04/2014   CLINICAL DATA:  Possible fall, with right shoulder dislocation and reduction per patient. Right anterior shoulder pain. Initial encounter.  EXAM: RIGHT SHOULDER - 2+ VIEW  COMPARISON:  None.  FINDINGS: There is no evidence of fracture or dislocation. The right humeral head is seated within the glenoid fossa. No Hill-Sachs or osseous Bankart lesion is seen. The acromioclavicular joint is unremarkable in appearance. No significant soft tissue abnormalities are seen. The visualized portions of the right lung are clear.  IMPRESSION: No evidence of fracture or dislocation.   Electronically Signed   By: Roanna Raider M.D.   On: 12/04/2014 02:06   Dg Humerus Right  12/04/2014   CLINICAL DATA:  Possible fall. Anterior right shoulder pain. Status post reduction of right shoulder, per patient. Initial encounter.  EXAM: RIGHT HUMERUS - 2+ VIEW  COMPARISON:  None.  FINDINGS: There is no evidence of fracture or dislocation. The right humerus appears grossly intact. The right humeral head remains seated at the glenoid fossa. The right acromioclavicular joint is unremarkable in appearance.  The visualized portions of the right lung are clear. No definite soft tissue abnormalities are characterized on radiograph.  IMPRESSION: No evidence of fracture or dislocation.   Electronically Signed   By: Beryle Beams.D.  On: 12/04/2014 02:05   I have personally reviewed and evaluated these images and lab results as part of my medical decision-making.   EKG Interpretation None      MDM   Final diagnoses:  None    1. Alcohol intoxication  The patient will be observed over night and re-evaluated when more sober for further  disposition.    Elpidio Anis, PA-C 12/04/14 2032  Tomasita Crumble, MD 12/11/14 [phone]

## 2014-12-04 NOTE — Discharge Instructions (Signed)
Alcohol Intoxication  Alcohol intoxication occurs when the amount of alcohol that a person has consumed impairs his or her ability to mentally and physically function. Alcohol directly impairs the normal chemical activity of the brain. Drinking large amounts of alcohol can lead to changes in mental function and behavior, and it can cause many physical effects that can be harmful.   Alcohol intoxication can range in severity from mild to very severe. Various factors can affect the level of intoxication that occurs, such as the person's age, gender, weight, frequency of alcohol consumption, and the presence of other medical conditions (such as diabetes, seizures, or heart conditions). Dangerous levels of alcohol intoxication may occur when people drink large amounts of alcohol in a short period (binge drinking). Alcohol can also be especially dangerous when combined with certain prescription medicines or "recreational" drugs.  SIGNS AND SYMPTOMS  Some common signs and symptoms of mild alcohol intoxication include:  · Loss of coordination.  · Changes in mood and behavior.  · Impaired judgment.  · Slurred speech.  As alcohol intoxication progresses to more severe levels, other signs and symptoms will appear. These may include:  · Vomiting.  · Confusion and impaired memory.  · Slowed breathing.  · Seizures.  · Loss of consciousness.  DIAGNOSIS   Your health care provider will take a medical history and perform a physical exam. You will be asked about the amount and type of alcohol you have consumed. Blood tests will be done to measure the concentration of alcohol in your blood. In many places, your blood alcohol level must be lower than 80 mg/dL (0.08%) to legally drive. However, many dangerous effects of alcohol can occur at much lower levels.   TREATMENT   People with alcohol intoxication often do not require treatment. Most of the effects of alcohol intoxication are temporary, and they go away as the alcohol naturally  leaves the body. Your health care provider will monitor your condition until you are stable enough to go home. Fluids are sometimes given through an IV access tube to help prevent dehydration.   HOME CARE INSTRUCTIONS  · Do not drive after drinking alcohol.  · Stay hydrated. Drink enough water and fluids to keep your urine clear or pale yellow. Avoid caffeine.    · Only take over-the-counter or prescription medicines as directed by your health care provider.    SEEK MEDICAL CARE IF:   · You have persistent vomiting.    · You do not feel better after a few days.  · You have frequent alcohol intoxication. Your health care provider can help determine if you should see a substance use treatment counselor.  SEEK IMMEDIATE MEDICAL CARE IF:   · You become shaky or tremble when you try to stop drinking.    · You shake uncontrollably (seizure).    · You throw up (vomit) blood. This may be bright red or may look like black coffee grounds.    · You have blood in your stool. This may be bright red or may appear as a black, tarry, bad smelling stool.    · You become lightheaded or faint.    MAKE SURE YOU:   · Understand these instructions.  · Will watch your condition.  · Will get help right away if you are not doing well or get worse.  Document Released: 12/31/2004 Document Revised: 11/23/2012 Document Reviewed: 08/26/2012  ExitCare® Patient Information ©2015 [employer]. This information is not intended to replace advice given to you by your health care provider. Make sure   you discuss any questions you have with your health care provider.

## 2014-12-04 NOTE — ED Provider Notes (Signed)
Patient awake, given motrin for shoulder pain. Clinically sober. Discharged.  1. Right shoulder pain   2. Alcohol intoxication, uncomplicated      Elwin Mocha, MD 12/04/14 [id number]

## 2014-12-04 NOTE — ED Notes (Addendum)
Per EMS- c/o right shoulder pain, ETOH on board. Pt says his right shoulder was dislocated and he put it back into place. No obvious deformities. Patient thinks he fell.  No other c/c. VSS.

## 2014-12-21 ENCOUNTER — Emergency Department (HOSPITAL_COMMUNITY)
Admission: EM | Admit: 2014-12-21 | Discharge: 2014-12-21 | Disposition: A | Discharge status: Other Acute Inpatient Hospital | Payer: Self-pay | Attending: Emergency Medicine | Admitting: Emergency Medicine

## 2014-12-21 ENCOUNTER — Encounter (HOSPITAL_COMMUNITY): Payer: Self-pay

## 2014-12-21 ENCOUNTER — Emergency Department (HOSPITAL_COMMUNITY): Payer: Self-pay

## 2014-12-21 ENCOUNTER — Inpatient Hospital Stay (HOSPITAL_COMMUNITY)
Admission: AD | Admit: 2014-12-21 | Discharge: 2014-12-27 | DRG: 897 | Disposition: A | Discharge status: Other Acute Inpatient Hospital | Payer: Federal, State, Local not specified - Other | Source: Intra-hospital | Attending: Emergency Medicine | Admitting: Emergency Medicine

## 2014-12-21 ENCOUNTER — Encounter (HOSPITAL_COMMUNITY): Payer: Self-pay | Admitting: *Deleted

## 2014-12-21 DIAGNOSIS — F1010 Alcohol abuse, uncomplicated: Secondary | ICD-10-CM

## 2014-12-21 DIAGNOSIS — K922 Gastrointestinal hemorrhage, unspecified: Secondary | ICD-10-CM | POA: Diagnosis present

## 2014-12-21 DIAGNOSIS — R45850 Homicidal ideations: Secondary | ICD-10-CM | POA: Diagnosis present

## 2014-12-21 DIAGNOSIS — F10930 Alcohol use, unspecified with withdrawal, uncomplicated: Secondary | ICD-10-CM

## 2014-12-21 DIAGNOSIS — F329 Major depressive disorder, single episode, unspecified: Secondary | ICD-10-CM | POA: Diagnosis present

## 2014-12-21 DIAGNOSIS — R251 Tremor, unspecified: Secondary | ICD-10-CM | POA: Insufficient documentation

## 2014-12-21 DIAGNOSIS — R45851 Suicidal ideations: Secondary | ICD-10-CM | POA: Diagnosis present

## 2014-12-21 DIAGNOSIS — F1490 Cocaine use, unspecified, uncomplicated: Secondary | ICD-10-CM | POA: Diagnosis present

## 2014-12-21 DIAGNOSIS — F1210 Cannabis abuse, uncomplicated: Secondary | ICD-10-CM | POA: Insufficient documentation

## 2014-12-21 DIAGNOSIS — Z590 Homelessness: Secondary | ICD-10-CM | POA: Diagnosis not present

## 2014-12-21 DIAGNOSIS — G8929 Other chronic pain: Secondary | ICD-10-CM | POA: Insufficient documentation

## 2014-12-21 DIAGNOSIS — F410 Panic disorder [episodic paroxysmal anxiety] without agoraphobia: Secondary | ICD-10-CM | POA: Diagnosis present

## 2014-12-21 DIAGNOSIS — Y999 Unspecified external cause status: Secondary | ICD-10-CM | POA: Insufficient documentation

## 2014-12-21 DIAGNOSIS — F1020 Alcohol dependence, uncomplicated: Secondary | ICD-10-CM | POA: Diagnosis present

## 2014-12-21 DIAGNOSIS — F1994 Other psychoactive substance use, unspecified with psychoactive substance-induced mood disorder: Secondary | ICD-10-CM | POA: Diagnosis present

## 2014-12-21 DIAGNOSIS — G4700 Insomnia, unspecified: Secondary | ICD-10-CM | POA: Diagnosis present

## 2014-12-21 DIAGNOSIS — F4310 Post-traumatic stress disorder, unspecified: Secondary | ICD-10-CM | POA: Diagnosis present

## 2014-12-21 DIAGNOSIS — F17210 Nicotine dependence, cigarettes, uncomplicated: Secondary | ICD-10-CM | POA: Diagnosis present

## 2014-12-21 DIAGNOSIS — Y929 Unspecified place or not applicable: Secondary | ICD-10-CM | POA: Insufficient documentation

## 2014-12-21 DIAGNOSIS — M25511 Pain in right shoulder: Secondary | ICD-10-CM

## 2014-12-21 DIAGNOSIS — F10230 Alcohol dependence with withdrawal, uncomplicated: Secondary | ICD-10-CM | POA: Insufficient documentation

## 2014-12-21 DIAGNOSIS — F332 Major depressive disorder, recurrent severe without psychotic features: Secondary | ICD-10-CM | POA: Diagnosis not present

## 2014-12-21 DIAGNOSIS — Y9339 Activity, other involving climbing, rappelling and jumping off: Secondary | ICD-10-CM | POA: Insufficient documentation

## 2014-12-21 DIAGNOSIS — S4991XA Unspecified injury of right shoulder and upper arm, initial encounter: Secondary | ICD-10-CM | POA: Insufficient documentation

## 2014-12-21 DIAGNOSIS — Z720 Tobacco use: Secondary | ICD-10-CM | POA: Insufficient documentation

## 2014-12-21 DIAGNOSIS — M549 Dorsalgia, unspecified: Secondary | ICD-10-CM

## 2014-12-21 DIAGNOSIS — T1491 Suicide attempt: Secondary | ICD-10-CM | POA: Diagnosis not present

## 2014-12-21 DIAGNOSIS — W1789XA Other fall from one level to another, initial encounter: Secondary | ICD-10-CM | POA: Insufficient documentation

## 2014-12-21 DIAGNOSIS — R5383 Other fatigue: Secondary | ICD-10-CM | POA: Insufficient documentation

## 2014-12-21 DIAGNOSIS — R51 Headache: Secondary | ICD-10-CM | POA: Insufficient documentation

## 2014-12-21 HISTORY — DX: Torsion of testis, unspecified: N44.00

## 2014-12-21 LAB — COMPREHENSIVE METABOLIC PANEL
ALBUMIN: 3.9 g/dL (ref 3.5–5.0)
ALT: 23 U/L (ref 17–63)
ANION GAP: 14 (ref 5–15)
AST: 56 U/L — AB (ref 15–41)
Alkaline Phosphatase: 73 U/L (ref 38–126)
BUN: 7 mg/dL (ref 6–20)
CO2: 20 mmol/L — AB (ref 22–32)
Calcium: 9.1 mg/dL (ref 8.9–10.3)
Chloride: 102 mmol/L (ref 101–111)
Creatinine, Ser: 0.76 mg/dL (ref 0.61–1.24)
GFR calc Af Amer: >60 mL/min (ref >60)
GFR calc non Af Amer: >60 mL/min (ref >60)
GLUCOSE: 92 mg/dL (ref 65–99)
POTASSIUM: 4.1 mmol/L (ref 3.5–5.1)
SODIUM: 136 mmol/L (ref 135–145)
Total Bilirubin: 0.8 mg/dL (ref 0.3–1.2)
Total Protein: 6.9 g/dL (ref 6.5–8.1)

## 2014-12-21 LAB — RAPID URINE DRUG SCREEN, HOSP PERFORMED
AMPHETAMINES: NOT DETECTED
Barbiturates: NOT DETECTED
Benzodiazepines: NOT DETECTED
Cocaine: NOT DETECTED
Opiates: NOT DETECTED
TETRAHYDROCANNABINOL: POSITIVE — AB

## 2014-12-21 LAB — ETHANOL: Alcohol, Ethyl (B): 5 mg/dL — ABNORMAL HIGH (ref <5)

## 2014-12-21 LAB — ACETAMINOPHEN LEVEL: Acetaminophen (Tylenol), Serum: <10 ug/mL — ABNORMAL LOW (ref 10–30)

## 2014-12-21 LAB — CBC
HCT: 41.3 % (ref 39.0–52.0)
Hemoglobin: 14.1 g/dL (ref 13.0–17.0)
MCH: 30.1 pg (ref 26.0–34.0)
MCHC: 34.1 g/dL (ref 30.0–36.0)
MCV: 88.1 fL (ref 78.0–100.0)
PLATELETS: 165 10*3/uL (ref 150–400)
RBC: 4.69 MIL/uL (ref 4.22–5.81)
RDW: 15.6 % — ABNORMAL HIGH (ref 11.5–15.5)
WBC: 4.2 10*3/uL (ref 4.0–10.5)

## 2014-12-21 LAB — SALICYLATE LEVEL: Salicylate Lvl: <4 mg/dL (ref 2.8–30.0)

## 2014-12-21 MED ORDER — ONDANSETRON 4 MG PO TBDP
4.0000 mg | ORAL_TABLET | Freq: Four times a day (QID) | ORAL | Status: AC | PRN
  Administered 2014-12-22 (×2): 4 mg via ORAL
  Filled 2014-12-21 (×2): qty 1

## 2014-12-21 MED ORDER — PNEUMOCOCCAL VAC POLYVALENT 25 MCG/0.5ML IJ INJ: 0.5000 mL | INJECTION | INTRAMUSCULAR | Status: DC

## 2014-12-21 MED ORDER — CHLORDIAZEPOXIDE HCL 25 MG PO CAPS
25.0000 mg | ORAL_CAPSULE | Freq: Four times a day (QID) | ORAL | Status: AC
  Administered 2014-12-21 – 2014-12-23 (×6): 25 mg via ORAL
  Filled 2014-12-21 (×6): qty 1

## 2014-12-21 MED ORDER — ZOLPIDEM TARTRATE 5 MG PO TABS: 5.0000 mg | ORAL_TABLET | Freq: Every evening | ORAL | Status: DC | PRN

## 2014-12-21 MED ORDER — CHLORDIAZEPOXIDE HCL 25 MG PO CAPS
25.0000 mg | ORAL_CAPSULE | Freq: Four times a day (QID) | ORAL | Status: AC | PRN
  Administered 2014-12-21 – 2014-12-24 (×7): 25 mg via ORAL
  Filled 2014-12-21 (×7): qty 1

## 2014-12-21 MED ORDER — NICOTINE 21 MG/24HR TD PT24
21.0000 mg | MEDICATED_PATCH | Freq: Every day | TRANSDERMAL | Status: DC
  Administered 2014-12-21: 21 mg via TRANSDERMAL
  Filled 2014-12-21: qty 1

## 2014-12-21 MED ORDER — CHLORDIAZEPOXIDE HCL 25 MG PO CAPS
25.0000 mg | ORAL_CAPSULE | ORAL | Status: AC
  Administered 2014-12-24 – 2014-12-25 (×2): 25 mg via ORAL
  Filled 2014-12-21 (×2): qty 1

## 2014-12-21 MED ORDER — THIAMINE HCL 100 MG/ML IJ SOLN: 100.0000 mg | Freq: Every day | INTRAMUSCULAR | Status: DC

## 2014-12-21 MED ORDER — ALUM & MAG HYDROXIDE-SIMETH 200-200-20 MG/5ML PO SUSP: 30.0000 mL | ORAL | Status: DC | PRN

## 2014-12-21 MED ORDER — VITAMIN B-1 100 MG PO TABS
100.0000 mg | ORAL_TABLET | Freq: Every day | ORAL | Status: DC
  Administered 2014-12-21: 100 mg via ORAL
  Filled 2014-12-21: qty 1

## 2014-12-21 MED ORDER — LORAZEPAM 2 MG/ML IJ SOLN
2.0000 mg | Freq: Once | INTRAMUSCULAR | Status: AC
  Administered 2014-12-21: 2 mg via INTRAVENOUS
  Filled 2014-12-21: qty 1

## 2014-12-21 MED ORDER — ACETAMINOPHEN 325 MG PO TABS
650.0000 mg | ORAL_TABLET | Freq: Four times a day (QID) | ORAL | Status: DC | PRN
Start: 2014-12-21 — End: 2014-12-27
  Administered 2014-12-22 – 2014-12-25 (×6): 650 mg via ORAL
  Filled 2014-12-21 (×6): qty 2

## 2014-12-21 MED ORDER — ONDANSETRON HCL 4 MG PO TABS: 4.0000 mg | ORAL_TABLET | Freq: Three times a day (TID) | ORAL | Status: DC | PRN

## 2014-12-21 MED ORDER — INFLUENZA VAC SPLIT QUAD 0.5 ML IM SUSY
0.5000 mL | PREFILLED_SYRINGE | INTRAMUSCULAR | Status: DC
  Filled 2014-12-21: qty 0.5

## 2014-12-21 MED ORDER — HYDROXYZINE HCL 25 MG PO TABS
25.0000 mg | ORAL_TABLET | Freq: Four times a day (QID) | ORAL | Status: AC | PRN
  Administered 2014-12-21 – 2014-12-24 (×6): 25 mg via ORAL
  Filled 2014-12-21 (×6): qty 1

## 2014-12-21 MED ORDER — LORAZEPAM 2 MG/ML IJ SOLN: 0.5000 mg | Freq: Once | INTRAMUSCULAR | Status: DC

## 2014-12-21 MED ORDER — LOPERAMIDE HCL 2 MG PO CAPS
2.0000 mg | ORAL_CAPSULE | ORAL | Status: AC | PRN
  Administered 2014-12-22: 4 mg via ORAL
  Administered 2014-12-22: 2 mg via ORAL
  Administered 2014-12-22 – 2014-12-23 (×2): 4 mg via ORAL
  Filled 2014-12-21: qty 2
  Filled 2014-12-21: qty 1
  Filled 2014-12-21 (×2): qty 2

## 2014-12-21 MED ORDER — MAGNESIUM HYDROXIDE 400 MG/5ML PO SUSP: 30.0000 mL | Freq: Every day | ORAL | Status: DC | PRN

## 2014-12-21 MED ORDER — CHLORDIAZEPOXIDE HCL 25 MG PO CAPS
25.0000 mg | ORAL_CAPSULE | Freq: Every day | ORAL | Status: AC
  Administered 2014-12-26: 25 mg via ORAL
  Filled 2014-12-21: qty 1

## 2014-12-21 MED ORDER — CHLORDIAZEPOXIDE HCL 25 MG PO CAPS
25.0000 mg | ORAL_CAPSULE | Freq: Three times a day (TID) | ORAL | Status: AC
  Administered 2014-12-23 – 2014-12-24 (×3): 25 mg via ORAL
  Filled 2014-12-21 (×3): qty 1

## 2014-12-21 MED ORDER — NICOTINE 21 MG/24HR TD PT24
21.0000 mg | MEDICATED_PATCH | Freq: Every day | TRANSDERMAL | Status: DC
  Administered 2014-12-22 – 2014-12-24 (×3): 21 mg via TRANSDERMAL
  Filled 2014-12-21 (×5): qty 1

## 2014-12-21 MED ORDER — ALUM & MAG HYDROXIDE-SIMETH 200-200-20 MG/5ML PO SUSP
30.0000 mL | ORAL | Status: DC | PRN
Start: 2014-12-21 — End: 2014-12-21

## 2014-12-21 MED ORDER — LORAZEPAM 1 MG PO TABS
0.0000 mg | ORAL_TABLET | Freq: Two times a day (BID) | ORAL | Status: DC
Start: 2014-12-23 — End: 2014-12-21

## 2014-12-21 MED ORDER — VITAMIN B-1 100 MG PO TABS
100.0000 mg | ORAL_TABLET | Freq: Every day | ORAL | Status: DC
  Administered 2014-12-22 – 2014-12-27 (×6): 100 mg via ORAL
  Filled 2014-12-21 (×8): qty 1

## 2014-12-21 MED ORDER — IBUPROFEN 400 MG PO TABS
600.0000 mg | ORAL_TABLET | Freq: Three times a day (TID) | ORAL | Status: DC | PRN
  Administered 2014-12-21 (×2): 600 mg via ORAL
  Filled 2014-12-21 (×4): qty 1

## 2014-12-21 MED ORDER — THIAMINE HCL 100 MG/ML IJ SOLN: 100.0000 mg | Freq: Once | INTRAMUSCULAR | Status: DC

## 2014-12-21 MED ORDER — TRAZODONE HCL 100 MG PO TABS
100.0000 mg | ORAL_TABLET | Freq: Every evening | ORAL | Status: DC | PRN
  Administered 2014-12-22 – 2014-12-23 (×2): 100 mg via ORAL
  Filled 2014-12-21 (×2): qty 1

## 2014-12-21 MED ORDER — TRAZODONE HCL 100 MG PO TABS
ORAL_TABLET | ORAL | Status: AC
  Filled 2014-12-21: qty 1

## 2014-12-21 MED ORDER — LORAZEPAM 1 MG PO TABS
0.0000 mg | ORAL_TABLET | Freq: Four times a day (QID) | ORAL | Status: DC
  Administered 2014-12-21 (×2): 2 mg via ORAL
  Filled 2014-12-21 (×2): qty 2

## 2014-12-21 MED ORDER — SODIUM CHLORIDE 0.9 % IV BOLUS (SEPSIS)
1000.0000 mL | Freq: Once | INTRAVENOUS | Status: AC
  Administered 2014-12-21: 1000 mL via INTRAVENOUS

## 2014-12-21 MED ORDER — ADULT MULTIVITAMIN W/MINERALS CH
1.0000 | ORAL_TABLET | Freq: Every day | ORAL | Status: DC
  Administered 2014-12-22 – 2014-12-27 (×6): 1 via ORAL
  Filled 2014-12-21 (×8): qty 1

## 2014-12-21 NOTE — ED Notes (Signed)
Lunch meal delivered. 
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
## 2014-12-21 NOTE — ED Notes (Signed)
Meal ordered
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
## 2014-12-21 NOTE — ED Notes (Signed)
Pt reports heavy alcohol abuse - 1/2 gallon a day. Last drink 1700 last night (9/15). Pt diaphoretic, vomiting, shaking/tremors, visibly agitated, headache. Pt tried to kill himself by jumping off 2nd story balcony 2 days ago - landed on right shoulder. Pt also uses marijuana.

## 2014-12-21 NOTE — ED Notes (Signed)
Pt is calm, cooperative; reports h/o multiple detox attempts - last one in February.  Pt reports he has had seizures due to withdrawal.  Pt reports ativan helped to calm him, continues to have visible tremors in hands.  Pt reports "they usually give me librium when I detox".

## 2014-12-21 NOTE — Tx Team (Signed)
Initial Interdisciplinary Treatment Plan   PATIENT STRESSORS: Financial difficulties Health problems Legal issue Substance abuse   PATIENT STRENGTHS: Ability for insight Average or above average intelligence Motivation for treatment/growth   PROBLEM LIST: Problem List/Patient Goals Date to be addressed Date deferred Reason deferred Estimated date of resolution  Suicidal Ideation      Depression      Substance abuse      "I want to stop drinking, and get the voices out of my head"                                     DISCHARGE CRITERIA:  Adequate post-discharge living arrangements Improved stabilization in mood, thinking, and/or behavior Motivation to continue treatment in a less acute level of care Need for constant or close observation no longer present Withdrawal symptoms are absent or subacute and managed without 24-hour nursing intervention  PRELIMINARY DISCHARGE PLAN: Attend aftercare/continuing care group Attend 12-step recovery group Outpatient therapy Placement in alternative living arrangements  PATIENT/FAMIILY INVOLVEMENT: This treatment plan has been presented to and reviewed with the patient, [name].  The patient and family have been given the opportunity to ask questions and make suggestions.  Juliann Pares 12/21/2014, 11:45 PM

## 2014-12-21 NOTE — ED Notes (Signed)
[facility] here to transport, pt states understanding, stable, ambulatory

## 2014-12-21 NOTE — ED Notes (Signed)
Paged security to wand pt, and staffing office for sitter.

## 2014-12-21 NOTE — BH Assessment (Signed)
[facility] Assessment Progress Note    Called and scheduled pt's tele assessment with this clinician as well as gathered clinical information from EDP Ray.  Kristen Butler, MS, LPC Therapeutic Triage Specialist [facility]     

## 2014-12-21 NOTE — ED Notes (Signed)
TTS in progress. Sitter at bedside. 

## 2014-12-21 NOTE — ED Provider Notes (Addendum)
CSN: [id number]     Arrival date & time 12/21/14  0730 History   First MD Initiated Contact with Patient 12/21/14 [phone]     Chief Complaint  Patient presents with  . Withdrawal     (Consider location/radiation/quality/duration/timing/severity/associated sxs/prior Treatment) HPI 34 year old male who presents today stating that he is withdrawing from alcohol. He states that he was suicidal jumped off a two-story level building. He says he is unable to tell me exactly when this occurred but thinks it was in the past 2 days. He states he was "in an alcohol haze."  He states that he has not had his alcohol since last night at about 4:00. He states that he is feeling very shaky. He states that he had a friend that saw him talking to a hole and followed him here to make sure that he came. He says that he wants to stop drinking. He says he has tried several times in the past and been through detox, but at those times, he states he was not really serious about stopping. Today he states that he really wants to stop because he is also depressed and feels that he is at the bottom. He states that when he jumped out the window he landed on his right shoulder. He states he then struck his head on the dirt. He is unclear whether he lost consciousness. He denies any lateralized weakness, numbness, or tingling. He denies any other injuries. History reviewed. No pertinent past medical history. History reviewed. No pertinent past surgical history. History reviewed. No pertinent family history. Social History  Substance Use Topics  . Smoking status: Current Every Day Smoker    Types: Cigarettes  . Smokeless tobacco: None  . Alcohol Use: Yes    Review of Systems  Constitutional: Positive for fatigue.  HENT: Negative for facial swelling.   Eyes: Negative.   Respiratory: Negative.   Cardiovascular: Negative.   Gastrointestinal: Negative.   Endocrine: Negative.   Genitourinary: Negative.   Musculoskeletal:  Positive for arthralgias. Negative for neck pain.  Skin: Negative.   Allergic/Immunologic: Negative.   Neurological: Positive for headaches.  Hematological: Negative.   Psychiatric/Behavioral: Positive for hallucinations and agitation.  All other systems reviewed and are negative.     Allergies  Review of patient's allergies indicates no known allergies.  Home Medications   Prior to Admission medications   Medication Sig Start Date End Date Taking? Authorizing Provider  ibuprofen (ADVIL,MOTRIN) 400 MG tablet Take 800 mg by mouth every 6 (six) hours as needed for mild pain.   Yes Historical Provider, MD   BP 144/89 mmHg  Pulse 88  Temp(Src) 98.1 F (36.7 C) (Oral)  Resp 18  SpO2 99% Physical Exam  Constitutional: He is oriented to person, place, and time. He appears well-developed and well-nourished.  HENT:  Head: Normocephalic and atraumatic.  Right Ear: External ear normal.  Left Ear: External ear normal.  Nose: Nose normal.  Mouth/Throat: Oropharynx is clear and moist.  Eyes: Conjunctivae and EOM are normal. Pupils are equal, round, and reactive to light.  Neck: Normal range of motion. Neck supple.  Cardiovascular: Normal rate, regular rhythm, normal heart sounds and intact distal pulses.   Pulmonary/Chest: Effort normal and breath sounds normal.  Abdominal: Soft. Bowel sounds are normal.  Musculoskeletal:       Right shoulder: He exhibits decreased range of motion, tenderness, bony tenderness and pain. He exhibits no swelling, no effusion, no crepitus, no deformity, no laceration, no spasm, normal pulse and  normal strength.       Arms: Neurological: He is alert and oriented to person, place, and time. He has normal reflexes.  Skin: Skin is warm and dry.  Psychiatric: He has a normal mood and affect. His behavior is normal. Judgment and thought content normal.  Nursing note and vitals reviewed.   ED Course  Procedures (including critical care time) Labs  Review Labs Reviewed  ETHANOL - Abnormal; Notable for the following:    Alcohol, Ethyl (B) 5 (*)    All other components within normal limits  CBC - Abnormal; Notable for the following:    RDW 15.6 (*)    All other components within normal limits  COMPREHENSIVE METABOLIC PANEL - Abnormal; Notable for the following:    CO2 20 (*)    AST 56 (*)    All other components within normal limits  ACETAMINOPHEN LEVEL - Abnormal; Notable for the following:    Acetaminophen (Tylenol), Serum <10 (*)    All other components within normal limits  SALICYLATE LEVEL  URINE RAPID DRUG SCREEN, HOSP PERFORMED    Imaging Review Dg Shoulder Right  12/21/2014   CLINICAL DATA:  Fall, proximal humerus pain  EXAM: RIGHT SHOULDER - 2+ VIEW  COMPARISON:  12/04/2014  FINDINGS: There is no evidence of fracture or dislocation. There is no evidence of arthropathy or other focal bone abnormality. Soft tissues are unremarkable. Incidental sclerotic bone island in the right humerus head.  IMPRESSION: No acute osseous finding.   Electronically Signed   By: Judie Petit.  Shick M.D.   On: 12/21/2014 08:52   Ct Head Wo Contrast  12/21/2014   CLINICAL DATA:  Pain after jumping from second story balcony two days prior  EXAM: CT HEAD WITHOUT CONTRAST  CT CERVICAL SPINE WITHOUT CONTRAST  TECHNIQUE: Multidetector CT imaging of the head and cervical spine was performed following the standard protocol without intravenous contrast. Multiplanar CT image reconstructions of the cervical spine were also generated.  COMPARISON:  Head CT December 12, 2005  FINDINGS: CT HEAD FINDINGS  The ventricles are normal in size and configuration. There is no intracranial mass hemorrhage, extra-axial fluid collection, or midline shift. Gray-white compartments are normal. No acute infarct evident. Bony calvarium appears intact. The mastoid air cells are clear. There are old fractures of the left nasal bone and medial left orbital wall.  CT CERVICAL SPINE FINDINGS   There is no fracture or spondylolisthesis. Prevertebral soft tissues and predental space regions are normal. There is no appreciable disc space narrowing. There is a calcified right paracentral disc protrusion at C3-4, not causing nerve root edema or effacement. There is mild exit foraminal narrowing at C3-4 and C4-5 due to bony hypertrophy. No disc extrusion or stenosis.  IMPRESSION: CT head: Old fractures of the left nasal bone and medial left orbital wall. No acute fracture evident. No intracranial mass, hemorrhage, or extra-axial fluid collection. Gray-white compartments appear normal.  CT cervical spine: Mild osteoarthritic change. No fracture or spondylolisthesis.   Electronically Signed   By: Bretta Bang III M.D.   On: 12/21/2014 09:12   Ct Cervical Spine Wo Contrast  12/21/2014   CLINICAL DATA:  Pain after jumping from second story balcony two days prior  EXAM: CT HEAD WITHOUT CONTRAST  CT CERVICAL SPINE WITHOUT CONTRAST  TECHNIQUE: Multidetector CT imaging of the head and cervical spine was performed following the standard protocol without intravenous contrast. Multiplanar CT image reconstructions of the cervical spine were also generated.  COMPARISON:  Head CT  December 12, 2005  FINDINGS: CT HEAD FINDINGS  The ventricles are normal in size and configuration. There is no intracranial mass hemorrhage, extra-axial fluid collection, or midline shift. Gray-white compartments are normal. No acute infarct evident. Bony calvarium appears intact. The mastoid air cells are clear. There are old fractures of the left nasal bone and medial left orbital wall.  CT CERVICAL SPINE FINDINGS  There is no fracture or spondylolisthesis. Prevertebral soft tissues and predental space regions are normal. There is no appreciable disc space narrowing. There is a calcified right paracentral disc protrusion at C3-4, not causing nerve root edema or effacement. There is mild exit foraminal narrowing at C3-4 and C4-5 due to  bony hypertrophy. No disc extrusion or stenosis.  IMPRESSION: CT head: Old fractures of the left nasal bone and medial left orbital wall. No acute fracture evident. No intracranial mass, hemorrhage, or extra-axial fluid collection. Gray-white compartments appear normal.  CT cervical spine: Mild osteoarthritic change. No fracture or spondylolisthesis.   Electronically Signed   By: Bretta Bang III M.D.   On: 12/21/2014 09:12   I have personally reviewed and evaluated these images and lab results as part of my medical decision-making.   EKG Interpretation None      MDM   Final diagnoses:  Suicidal ideation  Alcohol abuse  Alcohol withdrawal, uncomplicated  Chronic right shoulder pain    34 year old male history of alcohol abuse who presents today stating that he jumped due to suicidal ideation and has right shoulder pain and is having alcohol withdrawal. Vital signs here remained stable with heart rate between 88 and 101 with current rate of 88. Systolic blood pressure has ranged 2144 and 155 with current systolic blood pressure 144. 1 alcohol withdrawal-patient states he has not drank since yesterday and alcohol level is nondetectable. He is started on Ativan and alcohol withdrawal protocol. He is awake and alert, although he stated that he had been hallucinating earlier he shows no signs at this here. He is hemodynamically stable and can tolerate at, at this time, oral alcohol withdrawal protocol. 2 suicidal ideation. Patient states that he is suicidal. He states that he attempted to jump from a two-story building. Patient may be transferred to the psych area for evaluation by psychiatry. 3 right shoulder pain patient states he injured his right shoulder to jump. His x-ray here is normal. He has a history of chronic right shoulder pain. Due to history he had CT of his head and neck and plain x-rays of his thoracic spine based on exam. No acute abnormalities are noted.    Margarita Grizzle,  MD 12/21/14 1024  Psychiatry saw and evaluated and plan admission. Patient accepted to behavioral health. They do not have time to state it should be this afternoon.  Margarita Grizzle, MD 12/21/14 1610  Margarita Grizzle, MD 12/21/14 [phone]

## 2014-12-21 NOTE — Progress Notes (Signed)
Per Minerva Areola, [facility], pt accepted to [facility] bed 307-1 by Dr. Dub Mikes, report number [phone]. Admission is voluntary, pt can arrive anytime this afternoon. AC spoke with [facility] re: pt's placement.  Ilean Skill, MSW, LCSW Clinical Social Work, Disposition  12/21/2014 [phone]

## 2014-12-21 NOTE — ED Notes (Signed)
Spoke with LouAnne at [facility]  Notified [facility] was not called for transoport as reported at shift change.  Requested that [facility] is called at [phone] to arrange transport.

## 2014-12-22 DIAGNOSIS — R45851 Suicidal ideations: Secondary | ICD-10-CM

## 2014-12-22 DIAGNOSIS — F1020 Alcohol dependence, uncomplicated: Principal | ICD-10-CM

## 2014-12-22 DIAGNOSIS — T1491 Suicide attempt: Secondary | ICD-10-CM

## 2014-12-22 DIAGNOSIS — R45850 Homicidal ideations: Secondary | ICD-10-CM

## 2014-12-22 MED ORDER — OLANZAPINE 10 MG PO TBDP
10.0000 mg | ORAL_TABLET | Freq: Once | ORAL | Status: AC
  Administered 2014-12-22: 10 mg via ORAL
  Filled 2014-12-22 (×2): qty 1

## 2014-12-22 MED ORDER — QUETIAPINE FUMARATE 25 MG PO TABS
25.0000 mg | ORAL_TABLET | Freq: Three times a day (TID) | ORAL | Status: DC
  Administered 2014-12-22 – 2014-12-23 (×4): 25 mg via ORAL
  Filled 2014-12-22 (×8): qty 1

## 2014-12-22 NOTE — BHH Group Notes (Signed)
Adult Psychoeducational Group Note  Date:  12/22/2014 Time:  12:10 PM  Group Topic/Focus:  Making Healthy Choices:   The focus of this group is to help patients identify negative/unhealthy choices they were using prior to admission and identify positive/healthier coping strategies to replace them upon discharge.  Participation Level:  Did Not Attend  Participation Quality:    Affect:    Cognitive:    Insight:   Engagement in Group:     Modes of Intervention:    Additional Comments:   

## 2014-12-22 NOTE — Progress Notes (Signed)
D: Patient reports some auditory hallucination, diarrhea, tremors and anxiety; patient also stated " Im about to flip out"; patient became extremely red and was not able to go down to lunch; Clinical research associate spoke with the NP about the patient to go and see him; patient was ordered a one time dose of zyprexa  A: Monitored q 15 minutes; patient encouraged to attend groups; patient educated about medications; patient given medications per physician orders; patient encouraged to express feelings and/or concerns  R: Patient is very minimal with his peers but polite and respectful; patient has needed a few prn medications; patient was able to set goal to talk with staff 1:1 when having feelings of SI; patient is taking medications as prescribed and tolerating medications; patient has not been able to attend the groups
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
## 2014-12-22 NOTE — BHH Suicide Risk Assessment (Signed)
B[facility] INPATIENT:  Family/Significant Other Suicide Prevention Education  Suicide Prevention Education:  Patient Refusal for Family/Significant Other Suicide Prevention Education: The patient [name] has refused to provide written consent for family/significant other to be provided Family/Significant Other Suicide Prevention Education during admission and/or prior to discharge.  Physician notified.  Brochure was reviewed with pt and left with him as well.  He denied having access to any firearms.  He met all risk factors except for that one.  Grossman-Orr,  Jo 12/22/2014, 1:19 PM 

## 2014-12-22 NOTE — Progress Notes (Signed)
D .  Pt having rough detox, has had nausea and diarrhea today.  Pt did not feel well enough to attend evening AA group.  Interacting appropriately on unit.  Pt requested to take night time medication as soon as available due to not feeling well.  Reports passive SI but contracts for safety.  Denies HI at this time, but does endorse auditory hallucinations at times.  Pt states "People think I'm talking to myself, but I'm answering them".  The voices are intermittent.  A.  Support and encouragement offered, medication given as ordered for withdrawal.  R.  Pt remains safe on unit, will continue to monitor.

## 2014-12-22 NOTE — Progress Notes (Signed)
Patient did not attend the evening speaker AA meeting. Pt was notified that group was beginning but remained in bed.   

## 2014-12-22 NOTE — B[facility] Group Notes (Signed)
[facility] Group Notes:  (Clinical Social Work)  12/22/2014     10-11AM  Summary of Progress/Problems:   The main focus of today's process group was to learn how to use a decisional balance exercise to move toward a decision about drinking/drugging.  Patients listed needs on the whiteboard and unhealthy coping techniques often used to fill needs.  Motivational Interviewing and the whiteboard were utilized to help patients explore in depth the perceived benefits and costs of unhealthy coping techniques, as well as the  benefits and costs of replacing that with a healthy coping skills.    The patient expressed that SI/HI and alcohol are unhealthy coping techniques for him, and while he had indicated he would not speak in group, he did so a few times nonetheless.  He left before the end of group and did not return.  Type of Therapy:  Group Therapy - Process   Participation Level:  Active  Participation Quality:  Attentive and Sharing  Affect:  Blunted and Depressed  Cognitive:  Alert  Insight:  Improving  Engagement in Therapy:  Improving  Modes of Intervention:  Education, Motivational Interviewing  Ambrose Mantle, LCSW 12/22/2014, 12:37 PM
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
## 2014-12-23 ENCOUNTER — Encounter (HOSPITAL_COMMUNITY): Payer: Self-pay | Admitting: Registered Nurse

## 2014-12-23 MED ORDER — QUETIAPINE FUMARATE 50 MG PO TABS
50.0000 mg | ORAL_TABLET | Freq: Three times a day (TID) | ORAL | Status: DC
Start: 2014-12-23 — End: 2014-12-24
  Administered 2014-12-23 – 2014-12-24 (×2): 50 mg via ORAL
  Filled 2014-12-23 (×7): qty 1

## 2014-12-23 MED ORDER — GABAPENTIN 100 MG PO CAPS
200.0000 mg | ORAL_CAPSULE | Freq: Three times a day (TID) | ORAL | Status: DC
  Administered 2014-12-23 – 2014-12-24 (×2): 200 mg via ORAL
  Filled 2014-12-23 (×6): qty 2

## 2014-12-23 NOTE — [facility] Group Notes (Signed)
[facility] Group Notes:  (Nursing/MHT/Case Management/Adjunct)  Date:  12/23/2014  Time:  12:03 PM  Type of Therapy:  Psychoeducational Skills  Participation Level:  Active  Participation Quality:  Appropriate  Affect:  Appropriate  Cognitive:  Appropriate  Insight:  Appropriate  Engagement in Group:  Engaged  Modes of Intervention:  Discussion  Summary of Progress/Problems: Pt did attend self inventory group.   Jacquelyne Balint Shanta 12/23/2014, 12:03 PM

## 2014-12-23 NOTE — Progress Notes (Signed)
Patient ID: [name], male   DOB: Apr 15, 1980, 34 y.o.   MRN: [id number]   D: Pt has been very flat and depressed on the unit today. Pt has also been experiencing severe withdrawal symptoms, and required prn medication. Pt reported that his depression was a 8, his hopelessness was a 7, and his anxiety was a 9. Pt reported that he had no goal for today, he just wanted to feel better. Pt reported being negative SI/HI, no AH/VH noted. A: 15 min checks continued for patient safety. R: Pt safety maintained.

## 2014-12-23 NOTE — Progress Notes (Signed)
[facility] MD Progress Note  12/23/2014 7:13 PM [name]  MRN:  [id number] Subjective:  Pt states: "I'm a little upset today but the one-time dose of Zyprexa helped me sleep last night and the hallucinations are mostly clearing up now with that and the Seroquel. I'm just super pissed off for no reason".  Objective: Pt seen and chart reviewed. Pt is alert/oriented x4, mildly agitated, although improving from yesterday. He is reporting suicidal ideation but that it is improving. Now denies homicidal ideation. Pt reports that his meds are working some but that he took  Seroquel in the past; increased the 25tid up to 50tid for now. Pt reports that he cannot take Risperdal as he had severe Gynecomastia from that but no trouble with Seroquel. Pt reportedly made racial comments about certain patients and staff but he denies this to me in assessment.   Principal Problem: Alcohol use disorder, moderate, dependence Diagnosis:   Patient Active Problem List   Diagnosis Date Noted  . Substance induced mood disorder [F19.94] 11/16/2014  . Agitation [R45.1]   . Homicidal ideations [R45.850]    Total Time spent with patient: 25 minutes   Past Medical History: History reviewed. No pertinent past medical history. History reviewed. No pertinent past surgical history. Family History: History reviewed. No pertinent family history. Social History:  History  Alcohol Use  . Yes     History  Drug Use  . Yes  . Special: Marijuana    Social History   Social History  . Marital Status: Married    Spouse Name: N/A  . Number of Children: N/A  . Years of Education: N/A   Social History Main Topics  . Smoking status: Current Every Day Smoker    Types: Cigarettes  . Smokeless tobacco: None  . Alcohol Use: Yes  . Drug Use: Yes    Special: Marijuana  . Sexual Activity: Yes   Other Topics Concern  . None   Social History Narrative   Additional History:    Sleep: Fair  Appetite:   Fair   Assessment:   Musculoskeletal: Strength & Muscle Tone: within normal limits Gait & Station: normal Patient leans: N/A   Psychiatric Specialty Exam: Physical Exam  Review of Systems  Psychiatric/Behavioral: Positive for depression, suicidal ideas, hallucinations and substance abuse. The patient is nervous/anxious and has insomnia.   All other systems reviewed and are negative.   Blood pressure 115/84, pulse 86, temperature 97.4 F (36.3 C), temperature source Oral, resp. rate 18, height  (1.676 m), weight 62.143 kg (137 lb).Body mass index is 22.12 kg/(m^2).  General Appearance: Casual and Fairly Groomed  Patent attorney::  Good  Speech:  Clear and Coherent and Normal Rate  Volume:  Normal  Mood:  Anxious and Irritable  Affect:  Appropriate, Congruent and Labile  Thought Process:  Circumstantial  Orientation:  Full (Time, Place, and Person)  Thought Content:  WDL  Suicidal Thoughts:  Yes.  with intent/plan  Homicidal Thoughts:  No  Memory:  Immediate;   Fair Recent;   Fair Remote;   Fair  Judgement:  Fair  Insight:  Fair  Psychomotor Activity:  Normal  Concentration:  Good  Recall:  Good  Fund of Knowledge:Fair  Language: Fair  Akathisia:  No  Handed:    AIMS (if indicated):     Assets:  Communication Skills Desire for Improvement Resilience Social Support  ADL's:  Intact  Cognition: WNL  Sleep:  Number of Hours: 5.75     Current  Medications: Current Facility-Administered Medications  Medication Dose Route Frequency Provider Last Rate Last Dose  . acetaminophen (TYLENOL) tablet 650 mg  650 mg Oral Q6H PRN Worthy Flank, NP   650 mg at 12/23/14 1659  . alum & mag hydroxide-simeth (MAALOX/MYLANTA) 200-200-20 MG/5ML suspension 30 mL  30 mL Oral Q4H PRN Worthy Flank, NP      . chlordiazePOXIDE (LIBRIUM) capsule 25 mg  25 mg Oral Q6H PRN Worthy Flank, NP   25 mg at 12/23/14 1655  . chlordiazePOXIDE (LIBRIUM) capsule 25 mg  25 mg Oral TID Worthy Flank, NP   25 mg at 12/23/14 1655   Followed by  . [START ON 12/24/2014] chlordiazePOXIDE (LIBRIUM) capsule 25 mg  25 mg Oral BH-qamhs Worthy Flank, NP       Followed by  . [START ON 12/26/2014] chlordiazePOXIDE (LIBRIUM) capsule 25 mg  25 mg Oral Daily Worthy Flank, NP      . hydrOXYzine (ATARAX/VISTARIL) tablet 25 mg  25 mg Oral Q6H PRN Worthy Flank, NP   25 mg at 12/23/14 1655  . Influenza vac split quadrivalent PF (FLUARIX) injection 0.5 mL  0.5 mL Intramuscular Tomorrow-1000 Rachael Fee, MD   0.5 mL at 12/23/14 0908  . loperamide (IMODIUM) capsule 2-4 mg  2-4 mg Oral PRN Worthy Flank, NP   4 mg at 12/23/14 0644  . magnesium hydroxide (MILK OF MAGNESIA) suspension 30 mL  30 mL Oral Daily PRN Worthy Flank, NP      . multivitamin with minerals tablet 1 tablet  1 tablet Oral Daily Worthy Flank, NP   1 tablet at 12/23/14 0740  . nicotine (NICODERM CQ - dosed in mg/24 hours) patch 21 mg  21 mg Transdermal Daily Rachael Fee, MD   21 mg at 12/23/14 0740  . ondansetron (ZOFRAN-ODT) disintegrating tablet 4 mg  4 mg Oral Q6H PRN Worthy Flank, NP   4 mg at 12/22/14 2102  . pneumococcal 23 valent vaccine (PNU-IMMUNE) injection 0.5 mL  0.5 mL Intramuscular Tomorrow-1000 Rachael Fee, MD   0.5 mL at 12/23/14 0909  . QUEtiapine (SEROQUEL) tablet 25 mg  25 mg Oral TID Beau Fanny, FNP   25 mg at 12/23/14 1655  . thiamine (B-1) injection 100 mg  100 mg Intramuscular Once Worthy Flank, NP   100 mg at 12/21/14 2200  . thiamine (VITAMIN B-1) tablet 100 mg  100 mg Oral Daily Worthy Flank, NP   100 mg at 12/23/14 0740  . traZODone (DESYREL) tablet 100 mg  100 mg Oral QHS PRN Worthy Flank, NP   100 mg at 12/22/14 2102    Lab Results: No results found for this or any previous visit (from the past 48 hour(s)).  Physical Findings: AIMS: Facial and Oral Movements Muscles of Facial Expression: None, normal Lips and Perioral Area: None, normal Jaw: None, normal Tongue: None,  normal,Extremity Movements Upper (arms, wrists, hands, fingers): None, normal Lower (legs, knees, ankles, toes): None, normal, Trunk Movements Neck, shoulders, hips: None, normal, Overall Severity Severity of abnormal movements (highest score from questions above): None, normal Incapacitation due to abnormal movements: None, normal Patient's awareness of abnormal movements (rate only patient's report): No Awareness, Dental Status Current problems with teeth and/or dentures?: No Does patient usually wear dentures?: No  CIWA:  CIWA-Ar Total: 11 COWS:     Treatment Plan Summary: Daily contact with patient to assess and evaluate symptoms and  progress in treatment and Medication management  Medications: -ED had put him on Librium protocol for CIWA, it is finishing -Neurontin  tid for mood stabilization and chronic pain -Seroquel  tid for psychosis and severe agitation/anxiety -Trazodone  qhs PRN insomnia -Consider antidepressant although pt does not want this at this time; pt appears more agitated than depressed  Medical Decision Making:  Established Problem, Stable/Improving (1), Review of Psycho-Social Stressors (1), Review or order clinical lab tests (1), Review of Medication Regimen & Side Effects (2) and Review of New Medication or Change in Dosage (2)   Beau Fanny, FNP-BC 12/23/2014, 5:22 PM

## 2014-12-23 NOTE — Progress Notes (Signed)
Patient did attend the evening speaker AA meeting.  

## 2014-12-23 NOTE — [facility] Group Notes (Signed)
[facility] Group Notes:  (Clinical Social Work)  12/23/2014  10:00-11:00AM  Summary of Progress/Problems:   The main focus of today's process group was to   1)  discuss the importance of adding supports  2)  define health supports versus unhealthy supports  3)  identify the patient's current unhealthy supports and plan how to handle them  4)  Identify the patient's current healthy supports and plan what to add.  An emphasis was placed on using counselor, doctor, therapy groups, 12-step groups, and problem-specific support groups to expand supports.    The patient expressed full comprehension of the concepts presented, and agreed that there is a need to add more supports.  The patient stated that he has numerous supports at the [employer], educated other group members about what is available there.  He participated extensively in group until he became too anxious and agitated to remain still.  He was very supportive and respectful of other patients as well.  Type of Therapy:  Process Group with Motivational Interviewing  Participation Level:  Active  Participation Quality:  Appropriate, Attentive, Sharing and Supportive  Affect:  Blunted  Cognitive:  Alert, Appropriate and Oriented  Insight:  Engaged  Engagement in Therapy:  Engaged  Modes of Intervention:   Education, Support and Processing, Activity  Ambrose Mantle, LCSW 12/23/2014

## 2014-12-24 DIAGNOSIS — F1994 Other psychoactive substance use, unspecified with psychoactive substance-induced mood disorder: Secondary | ICD-10-CM

## 2014-12-24 DIAGNOSIS — F4310 Post-traumatic stress disorder, unspecified: Secondary | ICD-10-CM | POA: Diagnosis present

## 2014-12-24 DIAGNOSIS — F332 Major depressive disorder, recurrent severe without psychotic features: Secondary | ICD-10-CM

## 2014-12-24 LAB — URINALYSIS, ROUTINE W REFLEX MICROSCOPIC
BILIRUBIN URINE: NEGATIVE
GLUCOSE, UA: NEGATIVE mg/dL
Hgb urine dipstick: NEGATIVE
KETONES UR: NEGATIVE mg/dL
Leukocytes, UA: NEGATIVE
Nitrite: NEGATIVE
PH: 6.5 (ref 5.0–8.0)
Protein, ur: NEGATIVE mg/dL
SPECIFIC GRAVITY, URINE: 1.018 (ref 1.005–1.030)
Urobilinogen, UA: 0.2 mg/dL (ref 0.0–1.0)

## 2014-12-24 MED ORDER — IBUPROFEN 800 MG PO TABS
800.0000 mg | ORAL_TABLET | Freq: Four times a day (QID) | ORAL | Status: DC | PRN
  Administered 2014-12-24 – 2014-12-26 (×6): 800 mg via ORAL
  Filled 2014-12-24 (×6): qty 1

## 2014-12-24 MED ORDER — TRAZODONE HCL 50 MG PO TABS
150.0000 mg | ORAL_TABLET | Freq: Every evening | ORAL | Status: DC | PRN
  Administered 2014-12-24 – 2014-12-26 (×3): 150 mg via ORAL
  Filled 2014-12-24 (×3): qty 1

## 2014-12-24 MED ORDER — METHOCARBAMOL 500 MG PO TABS
500.0000 mg | ORAL_TABLET | Freq: Four times a day (QID) | ORAL | Status: DC | PRN
  Administered 2014-12-24 – 2014-12-25 (×3): 500 mg via ORAL
  Filled 2014-12-24 (×3): qty 1

## 2014-12-24 MED ORDER — BUPROPION HCL ER (XL) 150 MG PO TB24
150.0000 mg | ORAL_TABLET | Freq: Every day | ORAL | Status: DC
  Administered 2014-12-24 – 2014-12-26 (×3): 150 mg via ORAL
  Filled 2014-12-24 (×6): qty 1

## 2014-12-24 MED ORDER — GABAPENTIN 300 MG PO CAPS
300.0000 mg | ORAL_CAPSULE | Freq: Three times a day (TID) | ORAL | Status: DC
  Administered 2014-12-24 – 2014-12-25 (×4): 300 mg via ORAL
  Filled 2014-12-24 (×6): qty 1

## 2014-12-24 MED ORDER — ENSURE ENLIVE PO LIQD
237.0000 mL | Freq: Two times a day (BID) | ORAL | Status: DC
  Administered 2014-12-24 – 2014-12-26 (×5): 237 mL via ORAL
  Filled 2014-12-24 (×2): qty 237

## 2014-12-24 MED ORDER — QUETIAPINE FUMARATE 100 MG PO TABS
100.0000 mg | ORAL_TABLET | Freq: Three times a day (TID) | ORAL | Status: DC
  Administered 2014-12-24 – 2014-12-26 (×7): 100 mg via ORAL
  Filled 2014-12-24 (×10): qty 1

## 2014-12-24 MED ORDER — NICOTINE POLACRILEX 2 MG MT GUM
2.0000 mg | CHEWING_GUM | OROMUCOSAL | Status: DC | PRN
  Administered 2014-12-24 – 2014-12-26 (×12): 2 mg via ORAL
  Filled 2014-12-24 (×5): qty 1
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
## 2014-12-24 NOTE — Progress Notes (Signed)
D. Pt had been up and visible in milieu this evening, did attend and participate in evening group activity. Pt spoke of his day and spoke of his on-going anxiety and has also mentioned about having difficulties with sleep. Pt did report some on-going depression as well and has looked anxious while in the milieu. Pt did receive medications without incident and did not verbalize any complaints of pain. A. Support and encouragement provided. R. Safety maintained, will continue to monitor.

## 2014-12-24 NOTE — Clinical Social Work Note (Signed)
Referral made to [facility] at patient's request.   [name], MSW, [facility] Clinical Social Worker [facility] [phone]

## 2014-12-24 NOTE — Clinical Social Work Note (Signed)
Referrals faxed to [facility] and [facility] for review.   Samuella Bruin, MSW, [employer] Clinical Social Worker [facility] [phone]

## 2014-12-24 NOTE — [facility] Group Notes (Signed)
[facility] LCSW Group Therapy 12/24/2014  1:15 pm  Type of Therapy: Group Therapy Participation Level: Active  Participation Quality: Attentive, Sharing and Supportive  Affect: Appropriate  Cognitive: Alert and Oriented  Insight: Developing/Improving and Engaged  Engagement in Therapy: Developing/Improving and Engaged  Modes of Intervention: Clarification, Confrontation, Discussion, Education, Exploration,  Limit-setting, Orientation, Problem-solving, Rapport Building, Dance movement psychotherapist, Socialization and Support  Summary of Progress/Problems: Pt identified obstacles faced currently and processed barriers involved in overcoming these obstacles. Pt identified steps necessary for overcoming these obstacles and explored motivation (internal and external) for facing these difficulties head on. Pt further identified one area of concern in their lives and chose a goal to focus on for today. Patient identified his alcohol use as an obstacle and shared that he has had 18 months of sobriety in the past. He reports being motivated for [facility] term treatment, stating "I'm sick and tired of being sick and tired. I tried to kill myself, it was not a cry for help." CSW and other group members provided patient with emotional support and encouragement.  Samuella Bruin, MSW, [employer] Clinical Social Worker [facility] [phone]

## 2014-12-24 NOTE — Progress Notes (Signed)
Recreation Therapy Notes  Date: 09.19.2016 Time: 9:30am Location: [address] Group Room   Group Topic: Stress Management  Goal Area(s) Addresses:  Patient will actively participate in stress management techniques presented during session.   Behavioral Response: Did not attend.   Marykay Lex Blanchfield, LRT/CTRS  Blanchfield, Denise L 12/24/2014 1:36 PM

## 2014-12-24 NOTE — [facility] Group Notes (Signed)
.    [facility] LCSW Aftercare Discharge Planning Group Note  12/24/2014  8:45 AM   Participation Quality: Alert, Appropriate and Oriented  Mood/Affect: Depressed and Flat  Depression Rating: 7  Anxiety Rating: 7  Thoughts of Suicide: Pt endorses passive SI but contracts for safety  Will you contract for safety? Yes  Current AVH: Pt denies  Plan for Discharge/Comments: Pt attended discharge planning group and actively participated in group. CSW provided pt with today's workbook. Patient expressed interest in residential treatment at discharge.   Transportation Means: Pt reports access to transportation  Supports: No supports mentioned at this time  Samuella Bruin, MSW, [employer] Clinical Social Worker [employer] [facility] [phone]

## 2014-12-25 LAB — HIV ANTIBODY (ROUTINE TESTING W REFLEX): HIV SCREEN 4TH GENERATION: NONREACTIVE

## 2014-12-25 LAB — GC/CHLAMYDIA PROBE AMP (~~LOC~~) NOT AT ARMC
Chlamydia: NEGATIVE
NEISSERIA GONORRHEA: NEGATIVE

## 2014-12-25 LAB — RPR: RPR: NONREACTIVE

## 2014-12-25 MED ORDER — CYCLOBENZAPRINE HCL 10 MG PO TABS
10.0000 mg | ORAL_TABLET | Freq: Three times a day (TID) | ORAL | Status: DC | PRN
  Administered 2014-12-25 – 2014-12-26 (×4): 10 mg via ORAL
  Filled 2014-12-25 (×4): qty 1

## 2014-12-25 MED ORDER — GABAPENTIN 400 MG PO CAPS
400.0000 mg | ORAL_CAPSULE | Freq: Three times a day (TID) | ORAL | Status: DC
  Administered 2014-12-25 – 2014-12-26 (×3): 400 mg via ORAL
  Filled 2014-12-25 (×5): qty 1

## 2014-12-25 MED ORDER — QUETIAPINE FUMARATE 100 MG PO TABS: 100.0000 mg | ORAL_TABLET | Freq: Two times a day (BID) | ORAL | Status: DC | PRN

## 2014-12-25 MED ORDER — GABAPENTIN 400 MG PO CAPS
400.0000 mg | ORAL_CAPSULE | Freq: Every day | ORAL | Status: DC
  Administered 2014-12-25: 400 mg via ORAL
  Filled 2014-12-25 (×2): qty 1

## 2014-12-25 MED ORDER — QUETIAPINE FUMARATE 50 MG PO TABS
150.0000 mg | ORAL_TABLET | Freq: Every day | ORAL | Status: DC
  Administered 2014-12-25 – 2014-12-26 (×2): 150 mg via ORAL
  Filled 2014-12-25 (×3): qty 3

## 2014-12-25 NOTE — Plan of Care (Signed)
Problem: Ineffective individual coping Goal: STG: Patient will remain free from self harm Outcome: Progressing Pt safe on the unit     
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
## 2014-12-25 NOTE — Clinical Social Work Note (Signed)
[facility] referral faxed.  Tala Eber, MSW, LCSWA Clinical Social Worker [facility] [phone]  

## 2014-12-25 NOTE — Tx Team (Addendum)
Interdisciplinary Treatment Plan Update (Adult) Date: 12/25/2014   Time Reviewed: 9:30 AM  Progress in Treatment: Attending groups: Yes Participating in groups: Yes Taking medication as prescribed: Yes Tolerating medication: Yes Family/Significant other contact made: No patient has declined collateral contacts Patient understands diagnosis: Yes Discussing patient identified problems/goals with staff: Yes Medical problems stabilized or resolved: Yes Denies suicidal/homicidal ideation: No, patient endorses passive SI Issues/concerns per patient self-inventory: Yes Other:  New problem(s) identified: N/A  Discharge Plan or Barriers: Patient is interested in residential treatment at discharge. Patient is homeless and has limited social supports and no income.   9/20: Referrals pending at [facility], [facility], and [facility]. Declined at [facility] due to acuity.    Reason for Continuation of Hospitalization:  Depression Anxiety Medication Stabilization   Comments: N/A  Estimated length of stay: 2-4 days   [name] is a 34yo male hospitalized for a suicide attempt by jumping off 2-story building while intoxicated, reporting ongoing SI/HI, auditory hallucinations, PTSD symptoms. He is homeless since D/C from prison in Feb. 2016, and has to go back to jail, has upcoming court dates that have already been continued by his attorney, can be rescheduled again until he has completed rehab. He has no providers, would like referral to [facility]. No supports in his life. Wants to be tested for HIV, STDs and Hepatitis C while in the hospital. The patient would benefit from safety monitoring, medication evaluation, psychoeducation, group therapy, and discharge planning to link with ongoing resources. The patient declined referral to [facility] for smoking cessation. The Discharge Process and Patient Involvement form was reviewed with patient at the end of the Psychosocial Assessment, and the  patient confirmed understanding and signed that document, which was placed in the paper chart. Suicide Prevention Education was reviewed thoroughly, and a brochure left with patient. The patient refused consent for SPE to be provided to someone in his life.    Review of initial/current patient goals per problem list:  1. Goal(s): Patient will participate in aftercare plan   Met: No   Target date: 3-5 days post admission date   As evidenced by: Patient will participate within aftercare plan AEB aftercare provider and housing plan at discharge being identified.  9/20: Goal not met: CSW assessing for appropriate referrals for pt and will have follow up secured prior to d/c.      2. Goal (s): Patient will exhibit decreased depressive symptoms and suicidal ideations.   Met: Goal Progressing   Target date: 3-5 days post admission date   As evidenced by: Patient will utilize self rating of depression at 3 or below and demonstrate decreased signs of depression or be deemed stable for discharge by MD.  9/19: Patient rates depression at 7 today, experiencing passive SI.  9/21: Patient rates depression at 8 today, experiencing passive SI.     3. Goal(s): Patient will demonstrate decreased signs and symptoms of anxiety.   Met: Goal Progressing   Target date: 3-5 days post admission date   As evidenced by: Patient will utilize self rating of anxiety at 3 or below and demonstrated decreased signs of anxiety, or be deemed stable for discharge by MD   9/19: Patient rates anxiety at 7 today.   9/21: Patient rates anxiety as high today.   4. Goal(s): Patient will demonstrate decreased signs of withdrawal due to substance abuse   Met: Yes   Target date: 3-5 days post admission date   As evidenced by: Patient will produce a CIWA/COWS score of  0, have stable vitals signs, and no symptoms of withdrawal  9/19: Goal not met: Pt continues to have withdrawal symptoms of anxiety and  tremor and a CIWA score of a 3 today.  Pt to show decrease withdrawal symptoms prior to d/c.  9/21: Goal met: No withdrawal symptoms reported at this time per medical chart.      Attendees: Patient:    Family:    Physician: Dr. Sabra Heck 12/25/2014 9:30 AM  Nursing: Corine Shelter , RN 12/25/2014 9:30 AM  Clinical Social Worker: Tilden Fossa, [place] 12/25/2014 9:30 AM  Other: Peri Maris, LCSWA 12/25/2014 9:30 AM  Other: Lucinda Dell, [place] Sessions Liaison 12/25/2014 9:30 AM  Other:           Scribe for Treatment Team:  Tilden Fossa, MSW, [place] [phone]

## 2014-12-25 NOTE — Progress Notes (Signed)
[facility] MD Progress Note  12/25/2014 8:09 PM [name]  MRN:  [id number] Subjective:  [name] is still trying to get his life back together. He is still endorsing mood instability and episodes in which he " goes off." states going off gets him in trouble. He states when he gets upset like that he sees red and next thing he knows he is fighting hitting . He hopes that the medications when we get to full those will help him to take care of this. Understanding that he has to help himself. States he is committed to abstinence and would like to go to a residential treatment program afterwards. He did not sleep too well last night Principal Problem: Substance induced mood disorder Diagnosis:   Patient Active Problem List   Diagnosis Date Noted  . PTSD (post-traumatic stress disorder) [F43.10] 12/24/2014  . Substance induced mood disorder [F19.94] 11/16/2014  . Agitation [R45.1]   . Homicidal ideations [R45.850]    Total Time spent with patient: 30 minutes   Past Medical History: History reviewed. No pertinent past medical history. History reviewed. No pertinent past surgical history. Family History: History reviewed. No pertinent family history. Social History:  History  Alcohol Use  . Yes     History  Drug Use  . Yes  . Special: Marijuana    Social History   Social History  . Marital Status: Married    Spouse Name: N/A  . Number of Children: N/A  . Years of Education: N/A   Social History Main Topics  . Smoking status: Current Every Day Smoker    Types: Cigarettes  . Smokeless tobacco: None  . Alcohol Use: Yes  . Drug Use: Yes    Special: Marijuana  . Sexual Activity: Yes   Other Topics Concern  . None   Social History Narrative   Additional History:    Sleep: Poor  Appetite:  Fair   Assessment:   Musculoskeletal: Strength & Muscle Tone: within normal limits Gait & Station: normal Patient leans: normal   Psychiatric Specialty Exam: Physical Exam  Review of  Systems  Constitutional: Negative.   HENT: Negative.   Eyes: Negative.   Respiratory: Negative.   Cardiovascular: Negative.   Gastrointestinal: Negative.   Genitourinary: Negative.   Musculoskeletal: Positive for back pain.  Skin: Negative.   Neurological: Negative.   Endo/Heme/Allergies: Negative.   Psychiatric/Behavioral: Positive for depression and substance abuse. The patient is nervous/anxious.     Blood pressure 131/88, pulse 84, temperature 98.2 F (36.8 C), temperature source Oral, resp. rate 18, height  (1.676 m), weight 62.143 kg (137 lb).Body mass index is 22.12 kg/(m^2).  General Appearance: Fairly Groomed  Patent attorney::  Fair  Speech:  Clear and Coherent  Volume:  fluctuates  Mood:  Anxious and Dysphoric  Affect:  anxious worried  Thought Process:  Coherent and Goal Directed  Orientation:  Full (Time, Place, and Person)  Thought Content:  symptoms events worries concerns  Suicidal Thoughts:  No  Homicidal Thoughts:  No  Memory:  Immediate;   Fair Recent;   Fair Remote;   Fair  Judgement:  Fair  Insight:  Present  Psychomotor Activity:  Restlessness  Concentration:  Fair  Recall:  [employer] of Knowledge:Fair  Language: Fair  Akathisia:  No  Handed:  Right  AIMS (if indicated):     Assets:  Desire for Improvement  ADL's:  Intact  Cognition: WNL  Sleep:  Number of Hours: 4.25     Current  Medications: Current Facility-Administered Medications  Medication Dose Route Frequency Provider Last Rate Last Dose  . acetaminophen (TYLENOL) tablet 650 mg  650 mg Oral Q6H PRN Worthy Flank, NP   650 mg at 12/25/14 1427  . alum & mag hydroxide-simeth (MAALOX/MYLANTA) 200-200-20 MG/5ML suspension 30 mL  30 mL Oral Q4H PRN Worthy Flank, NP      . buPROPion (WELLBUTRIN XL) 24 hr tablet 150 mg  150 mg Oral Daily Rachael Fee, MD   150 mg at 12/25/14 1610  . [START ON 12/26/2014] chlordiazePOXIDE (LIBRIUM) capsule 25 mg  25 mg Oral Daily Worthy Flank, NP       . cyclobenzaprine (FLEXERIL) tablet 10 mg  10 mg Oral TID PRN Rachael Fee, MD   10 mg at 12/25/14 1429  . feeding supplement (ENSURE ENLIVE) (ENSURE ENLIVE) liquid 237 mL  237 mL Oral BID BM Rachael Fee, MD   237 mL at 12/25/14 1426  . gabapentin (NEURONTIN) capsule 400 mg  400 mg Oral TID Rachael Fee, MD   400 mg at 12/25/14 1701  . gabapentin (NEURONTIN) capsule 400 mg  400 mg Oral QHS Rachael Fee, MD      . ibuprofen (ADVIL,MOTRIN) tablet 800 mg  800 mg Oral Q6H PRN Rachael Fee, MD   800 mg at 12/25/14 1849  . Influenza vac split quadrivalent PF (FLUARIX) injection 0.5 mL  0.5 mL Intramuscular Tomorrow-1000 Rachael Fee, MD   0.5 mL at 12/23/14 0908  . magnesium hydroxide (MILK OF MAGNESIA) suspension 30 mL  30 mL Oral Daily PRN Worthy Flank, NP      . multivitamin with minerals tablet 1 tablet  1 tablet Oral Daily Worthy Flank, NP   1 tablet at 12/25/14 [phone]  . nicotine polacrilex (NICORETTE) gum 2 mg  2 mg Oral PRN Rachael Fee, MD   2 mg at 12/25/14 1136  . pneumococcal 23 valent vaccine (PNU-IMMUNE) injection 0.5 mL  0.5 mL Intramuscular Tomorrow-1000 Rachael Fee, MD   0.5 mL at 12/23/14 0909  . QUEtiapine (SEROQUEL) tablet 100 mg  100 mg Oral TID Rachael Fee, MD   100 mg at 12/25/14 1701  . QUEtiapine (SEROQUEL) tablet 100 mg  100 mg Oral BID PRN Rachael Fee, MD      . QUEtiapine (SEROQUEL) tablet 150 mg  150 mg Oral QHS Rachael Fee, MD      . thiamine (B-1) injection 100 mg  100 mg Intramuscular Once Worthy Flank, NP   100 mg at 12/21/14 2200  . thiamine (VITAMIN B-1) tablet 100 mg  100 mg Oral Daily Worthy Flank, NP   100 mg at 12/25/14 5409  . traZODone (DESYREL) tablet 150 mg  150 mg Oral QHS PRN Rachael Fee, MD   150 mg at 12/24/14 2206    Lab Results:  Results for orders placed or performed during the hospital encounter of 12/21/14 (from the past 48 hour(s))  GC/Chlamydia probe amp (Experiment)not at [facility]     Status: None   Collection Time:  12/24/14 12:00 AM  Result Value Ref Range   Chlamydia Negative     Comment: Normal Reference Range - Negative   Neisseria gonorrhea Negative     Comment: Normal Reference Range - Negative  HIV antibody     Status: None   Collection Time: 12/24/14  7:25 PM  Result Value Ref Range   HIV Screen 4th Generation  wRfx Non Reactive Non Reactive    Comment: (NOTE) Performed At: [facility] [address] [place], [place] [id number] Mila Homer MD WU:[id number] Performed at [facility]   RPR     Status: None   Collection Time: 12/24/14  7:25 PM  Result Value Ref Range   RPR Ser Ql Non Reactive Non Reactive    Comment: (NOTE) Performed At: [facility] [address] [place], [place] [id number] Mila Homer MD VH:[id number] Performed at [facility]   Urinalysis, Routine w reflex microscopic (not at [facility])     Status: None   Collection Time: 12/24/14  7:26 PM  Result Value Ref Range   Color, Urine YELLOW YELLOW   APPearance CLEAR CLEAR   Specific Gravity, Urine 1.018 1.005 - 1.030   pH 6.5 5.0 - 8.0   Glucose, UA NEGATIVE NEGATIVE mg/dL   Hgb urine dipstick NEGATIVE NEGATIVE   Bilirubin Urine NEGATIVE NEGATIVE   Ketones, ur NEGATIVE NEGATIVE mg/dL   Protein, ur NEGATIVE NEGATIVE mg/dL   Urobilinogen, UA 0.2 0.0 - 1.0 mg/dL   Nitrite NEGATIVE NEGATIVE   Leukocytes, UA NEGATIVE NEGATIVE    Comment: MICROSCOPIC NOT DONE ON URINES WITH NEGATIVE PROTEIN, BLOOD, LEUKOCYTES, NITRITE, OR GLUCOSE <1000 mg/dL. Performed at [facility]     Physical Findings: AIMS: Facial and Oral Movements Muscles of Facial Expression: None, normal Lips and Perioral Area: None, normal Jaw: None, normal Tongue: None, normal,Extremity Movements Upper (arms, wrists, hands, fingers): None, normal Lower (legs, knees, ankles, toes): None, normal, Trunk Movements Neck, shoulders, hips: None, normal, Overall Severity Severity of  abnormal movements (highest score from questions above): None, normal Incapacitation due to abnormal movements: None, normal Patient's awareness of abnormal movements (rate only patient's report): No Awareness, Dental Status Current problems with teeth and/or dentures?: No Does patient usually wear dentures?: No  CIWA:  CIWA-Ar Total: 2 COWS:     Treatment Plan Summary: Daily contact with patient to assess and evaluate symptoms and progress in treatment and Medication management Supportive approach/coping skills Polysubstance abuse ;work a relapse prevention plan Mood instability; continue to work with the Seroquel and the Neurontin Insomnia; will add a Seroquel at Bedtime to help with the ruminative thinking, will continue the Trazodone at 150 mg Back spasms; will switch to Flexeril what he has found more effective in the past Will explore residential treatment options  Medical Decision Making:  Review of Psycho-Social Stressors (1), Review or order clinical lab tests (1), Review of Medication Regimen & Side Effects (2) and Review of New Medication or Change in Dosage (2)     LUGO,IRVING A 12/25/2014, 8:09 PM

## 2014-12-25 NOTE — Progress Notes (Signed)
D:Pt rates his anxiety as a 9 on 0-10 scale with 10 being the most. Pt reports that he had a visual hallucination during group this morning where he was in a [place]. He has passive si and hi that he would not elaborate on. Pt has pain in his back. A:Offered support, encouragement and 15 minute checks. Gave scheduled and prn medications as ordered. Pt contracts for safety. Safety maintained on the unit.

## 2014-12-25 NOTE — Clinical Social Work Note (Signed)
CSW left voicemail for public defender Orland Jarred [phone] regarding court continuance. Awaiting return call.  Samuella Bruin, MSW, [employer] Clinical Social Worker [facility] [phone]

## 2014-12-25 NOTE — Plan of Care (Signed)
Problem: Ineffective individual coping Goal: LTG: Patient will report a decrease in negative feelings Outcome: Progressing Pt stated he was feeling better.  Problem: Alteration in mood Goal: LTG-Patient reports reduction in suicidal thoughts (Patient reports reduction in suicidal thoughts and is able to verbalize a safety plan for whenever patient is feeling suicidal)  Outcome: Not Progressing Pt SI/HI- contracts for safety

## 2014-12-25 NOTE — Progress Notes (Signed)
D: Pt denies SI/HI/AVH. Pt is pleasant and cooperative. Pt has good insight into Tx, pt has to work on anger issues, but has the will to work on it. Pt stated he was happy he came and will try to do better.   A: Pt was offered support and encouragement. Pt was given scheduled medications. Pt was encourage to attend groups. Q 15 minute checks were done for safety.  R:Pt attends groups and interacts well with peers and staff. Pt is taking medication. Pt has no complaints at this time .Pt receptive to treatment and safety maintained on unit.

## 2014-12-25 NOTE — Plan of Care (Signed)
Problem: Diagnosis: Increased Risk For Suicide Attempt Goal: LTG-Patient Will Report Improved Mood and Deny Suicidal LTG (by discharge) Patient will report improved mood and deny suicidal ideation.  Outcome: Not Progressing Pt continues to have passive si thoughts.

## 2014-12-25 NOTE — [facility] Group Notes (Signed)
[facility] LCSW Group Therapy 12/25/2014 1:15 PM Type of Therapy: Group Therapy Participation Level: Active  Participation Quality: Attentive, Sharing and Supportive  Affect: Appropriate  Cognitive: Alert and Oriented  Insight: Developing/Improving and Engaged  Engagement in Therapy: Developing/Improving and Engaged  Modes of Intervention: Activity, Clarification, Confrontation, Discussion, Education, Exploration, Limit-setting, Orientation, Problem-solving, Rapport Building, Reality Testing, Socialization and Support  Summary of Progress/Problems: Patient was attentive and engaged with speaker from [employer]. Patient was attentive to speaker while they shared their story of dealing with mental health and overcoming it. Patient expressed interest in their programs and services and received information on their agency. Patient processed ways they can relate to the speaker.   Ulices Maack, MSW, LCSWA Clinical Social Worker [facility] [phone]   

## 2014-12-25 NOTE — Progress Notes (Signed)
The focus of this group is to help patients establish daily goals to achieve during treatment and discuss how the patient can incorporate goal setting into their daily lives to aide in recovery. Pt attended the goals group and remained appropriate and engaged throughout the duration of the group. Pt shared that his goal today is to stay clean/ Pt also shared that he is trying to get into a 90 day-41month program for alcoholism when he leaves here. Pt stated that he wants to get a good foundation for sobriety when he leaves here.

## 2014-12-26 MED ORDER — QUETIAPINE FUMARATE 50 MG PO TABS
150.0000 mg | ORAL_TABLET | Freq: Three times a day (TID) | ORAL | Status: DC
  Administered 2014-12-26 – 2014-12-27 (×2): 150 mg via ORAL
  Filled 2014-12-26 (×6): qty 3

## 2014-12-26 MED ORDER — GABAPENTIN 300 MG PO CAPS
600.0000 mg | ORAL_CAPSULE | Freq: Four times a day (QID) | ORAL | Status: DC
  Administered 2014-12-26 – 2014-12-27 (×3): 600 mg via ORAL
  Filled 2014-12-26 (×8): qty 2

## 2014-12-26 MED ORDER — METHOCARBAMOL 500 MG PO TABS
750.0000 mg | ORAL_TABLET | Freq: Four times a day (QID) | ORAL | Status: DC
  Administered 2014-12-26 – 2014-12-27 (×4): 750 mg via ORAL
  Filled 2014-12-26 (×9): qty 1
  Filled 2014-12-26: qty 2
  Filled 2014-12-26: qty 1

## 2014-12-26 MED ORDER — GABAPENTIN 300 MG PO CAPS: 600.0000 mg | ORAL_CAPSULE | Freq: Every day | ORAL | Status: DC

## 2014-12-26 MED ORDER — BUPROPION HCL ER (XL) 300 MG PO TB24
300.0000 mg | ORAL_TABLET | Freq: Every day | ORAL | Status: DC
  Administered 2014-12-27: 300 mg via ORAL
  Filled 2014-12-26 (×3): qty 1

## 2014-12-26 NOTE — Progress Notes (Signed)
Pt attended NA group this evening.  

## 2014-12-26 NOTE — Progress Notes (Signed)
[facility] MD Progress Note  12/26/2014 5:58 PM [name]  MRN:  [id number] Subjective:  States he is not feeling too well. States he is trying hard to control himself and not go off. States he talks to himself has an internal dialogue trying to calm himself down. He still does not know where he will go from here. States that he has not been able to communicate with his lawyer. Wants all this legal stuff to be over so he can go on with his life. States he does not want the criminal-drug using life style. Wants to get himself together for himself but mostly for his kids. This is what he thinks about when he starts feeling like giving up.still endorses persistence of the underlying depression Principal Problem: Substance induced mood disorder Diagnosis:   Patient Active Problem List   Diagnosis Date Noted  . PTSD (post-traumatic stress disorder) [F43.10] 12/24/2014  . Substance induced mood disorder [F19.94] 11/16/2014  . Agitation [R45.1]   . Homicidal ideations [R45.850]    Total Time spent with patient: 30 minutes   Past Medical History: History reviewed. No pertinent past medical history. History reviewed. No pertinent past surgical history. Family History: History reviewed. No pertinent family history. Social History:  History  Alcohol Use  . Yes     History  Drug Use  . Yes  . Special: Marijuana    Social History   Social History  . Marital Status: Married    Spouse Name: N/A  . Number of Children: N/A  . Years of Education: N/A   Social History Main Topics  . Smoking status: Current Every Day Smoker    Types: Cigarettes  . Smokeless tobacco: None  . Alcohol Use: Yes  . Drug Use: Yes    Special: Marijuana  . Sexual Activity: Yes   Other Topics Concern  . None   Social History Narrative   Additional History:    Sleep: Fair  Appetite:  Fair   Assessment:   Musculoskeletal: Strength & Muscle Tone: within normal limits Gait & Station: normal Patient leans:  normal   Psychiatric Specialty Exam: Physical Exam  Review of Systems  Constitutional: Positive for malaise/fatigue.  HENT: Negative.   Eyes: Negative.   Respiratory: Negative.   Cardiovascular: Negative.   Gastrointestinal: Negative.   Genitourinary: Negative.   Musculoskeletal: Negative.   Skin: Negative.   Neurological: Positive for weakness.  Endo/Heme/Allergies: Negative.   Psychiatric/Behavioral: Positive for depression and substance abuse. The patient is nervous/anxious.     Blood pressure 110/77, pulse 89, temperature 98.2 F (36.8 C), temperature source Oral, resp. rate 16, height  (1.676 m), weight 62.143 kg (137 lb).Body mass index is 22.12 kg/(m^2).  General Appearance: Fairly Groomed  Patent attorney::  Fair  Speech:  Clear and Coherent  Volume:  fluctuates  Mood:  Anxious, Depressed and Dysphoric  Affect:  Restricted  Thought Process:  Coherent and Goal Directed  Orientation:  Full (Time, Place, and Person)  Thought Content:  symptoms events worries concerns  Suicidal Thoughts:  No  Homicidal Thoughts:  No  Memory:  Immediate;   Fair Recent;   Fair Remote;   Fair  Judgement:  Fair  Insight:  Present  Psychomotor Activity:  Restlessness  Concentration:  Fair  Recall:  [employer] of Knowledge:Fair  Language: Fair  Akathisia:  No  Handed:  Right  AIMS (if indicated):     Assets:  Desire for Improvement  ADL's:  Intact  Cognition: WNL  Sleep:  Number of Hours: 4.25     Current Medications: Current Facility-Administered Medications  Medication Dose Route Frequency Provider Last Rate Last Dose  . acetaminophen (TYLENOL) tablet 650 mg  650 mg Oral Q6H PRN Worthy Flank, NP   650 mg at 12/25/14 1427  . alum & mag hydroxide-simeth (MAALOX/MYLANTA) 200-200-20 MG/5ML suspension 30 mL  30 mL Oral Q4H PRN Worthy Flank, NP      . Melene Muller ON 12/27/2014] buPROPion (WELLBUTRIN XL) 24 hr tablet 300 mg  300 mg Oral Daily Rachael Fee, MD      . feeding  supplement (ENSURE ENLIVE) (ENSURE ENLIVE) liquid 237 mL  237 mL Oral BID BM Rachael Fee, MD   237 mL at 12/26/14 1345  . gabapentin (NEURONTIN) capsule 600 mg  600 mg Oral QID Rachael Fee, MD   600 mg at 12/26/14 1611  . ibuprofen (ADVIL,MOTRIN) tablet 800 mg  800 mg Oral Q6H PRN Rachael Fee, MD   800 mg at 12/26/14 0758  . Influenza vac split quadrivalent PF (FLUARIX) injection 0.5 mL  0.5 mL Intramuscular Tomorrow-1000 Rachael Fee, MD   0.5 mL at 12/23/14 0908  . magnesium hydroxide (MILK OF MAGNESIA) suspension 30 mL  30 mL Oral Daily PRN Worthy Flank, NP      . methocarbamol (ROBAXIN) tablet 750 mg  750 mg Oral QID Rachael Fee, MD   750 mg at 12/26/14 1611  . multivitamin with minerals tablet 1 tablet  1 tablet Oral Daily Worthy Flank, NP   1 tablet at 12/26/14 0757  . nicotine polacrilex (NICORETTE) gum 2 mg  2 mg Oral PRN Rachael Fee, MD   2 mg at 12/26/14 1442  . pneumococcal 23 valent vaccine (PNU-IMMUNE) injection 0.5 mL  0.5 mL Intramuscular Tomorrow-1000 Rachael Fee, MD   0.5 mL at 12/23/14 0909  . QUEtiapine (SEROQUEL) tablet 100 mg  100 mg Oral BID PRN Rachael Fee, MD      . QUEtiapine (SEROQUEL) tablet 150 mg  150 mg Oral QHS Rachael Fee, MD   150 mg at 12/25/14 2153  . QUEtiapine (SEROQUEL) tablet 150 mg  150 mg Oral TID Rachael Fee, MD   150 mg at 12/26/14 1611  . thiamine (B-1) injection 100 mg  100 mg Intramuscular Once Worthy Flank, NP   100 mg at 12/21/14 2200  . thiamine (VITAMIN B-1) tablet 100 mg  100 mg Oral Daily Worthy Flank, NP   100 mg at 12/26/14 0757  . traZODone (DESYREL) tablet 150 mg  150 mg Oral QHS PRN Rachael Fee, MD   150 mg at 12/25/14 2153    Lab Results:  Results for orders placed or performed during the hospital encounter of 12/21/14 (from the past 48 hour(s))  HIV antibody     Status: None   Collection Time: 12/24/14  7:25 PM  Result Value Ref Range   HIV Screen 4th Generation wRfx Non Reactive Non Reactive     Comment: (NOTE) Performed At: [facility] [address] [place], [place] [id number] Mila Homer MD WU:[id number] Performed at [facility]   RPR     Status: None   Collection Time: 12/24/14  7:25 PM  Result Value Ref Range   RPR Ser Ql Non Reactive Non Reactive    Comment: (NOTE) Performed At: [facility] [address] [place], [place] [id number] Mila Homer MD VH:[id number] Performed at [facility]  [facility]   Urinalysis, Routine w reflex microscopic (not at [facility])     Status: None   Collection Time: 12/24/14  7:26 PM  Result Value Ref Range   Color, Urine YELLOW YELLOW   APPearance CLEAR CLEAR   Specific Gravity, Urine 1.018 1.005 - 1.030   pH 6.5 5.0 - 8.0   Glucose, UA NEGATIVE NEGATIVE mg/dL   Hgb urine dipstick NEGATIVE NEGATIVE   Bilirubin Urine NEGATIVE NEGATIVE   Ketones, ur NEGATIVE NEGATIVE mg/dL   Protein, ur NEGATIVE NEGATIVE mg/dL   Urobilinogen, UA 0.2 0.0 - 1.0 mg/dL   Nitrite NEGATIVE NEGATIVE   Leukocytes, UA NEGATIVE NEGATIVE    Comment: MICROSCOPIC NOT DONE ON URINES WITH NEGATIVE PROTEIN, BLOOD, LEUKOCYTES, NITRITE, OR GLUCOSE <1000 mg/dL. Performed at [facility]     Physical Findings: AIMS: Facial and Oral Movements Muscles of Facial Expression: None, normal Lips and Perioral Area: None, normal Jaw: None, normal Tongue: None, normal,Extremity Movements Upper (arms, wrists, hands, fingers): None, normal Lower (legs, knees, ankles, toes): None, normal, Trunk Movements Neck, shoulders, hips: None, normal, Overall Severity Severity of abnormal movements (highest score from questions above): None, normal Incapacitation due to abnormal movements: None, normal Patient's awareness of abnormal movements (rate only patient's report): No Awareness, Dental Status Current problems with teeth and/or dentures?: No Does patient usually wear dentures?: No  CIWA:  CIWA-Ar Total: 1 COWS:      Treatment Plan Summary: Daily contact with patient to assess and evaluate symptoms and progress in treatment and Medication management Supportive approach/coping skills Substance abuse; continue to work a relapse prevention plan Mood instability; will continue the Seroquel and increase to 150 mg TID and 200 mg HS Depression; will increase the Wellbutrin to 300 mg inAM Anxiety/agitation/pain; continue to work with the Neurontin increase to 600 mg QID CBT/mindfulness Explore residential treatment options  Medical Decision Making:  Review of Psycho-Social Stressors (1), Review or order clinical lab tests (1), Review of Medication Regimen & Side Effects (2) and Review of New Medication or Change in Dosage (2)     LUGO,IRVING A 12/26/2014, 5:58 PM

## 2014-12-26 NOTE — Progress Notes (Signed)
Recreation Therapy Notes  Date: 09.21.2016 Time:  9:30am Location: [address] Group Room   Group Topic: Stress Management  Goal Area(s) Addresses:  Patient will actively participate in stress management techniques presented during session.   Behavioral Response: Did not attend.   Denise L Blanchfield, LRT/CTRS        Blanchfield, Denise L 12/26/2014 2:43 PM 

## 2014-12-26 NOTE — Progress Notes (Signed)
Adult Psychoeducational Group Note  Date:  12/26/2014 Time:  8:50 PM  Group Topic/Focus:  Wrap-Up Group:   The focus of this group is to help patients review their daily goal of treatment and discuss progress on daily workbooks.  Participation Level:  Active  Participation Quality:  Appropriate  Affect:  Appropriate  Cognitive:  Appropriate  Insight: Appropriate  Engagement in Group:  Engaged  Modes of Intervention:  Discussion  Additional Comments:  Pt rated day a 5 out of 10. Pt reported that he achieved his goals for the day. Pt reported that a positive part of his day was staying out of trouble.   Cleotilde Neer 12/26/2014, 12:41 AM

## 2014-12-26 NOTE — [facility] Group Notes (Signed)
[facility] LCSW Group Therapy 12/26/2014  1:15 PM Type of Therapy: Group Therapy Participation Level: Active  Participation Quality: Attentive, Sharing and Supportive  Affect: Appropriate  Cognitive: Alert and Oriented  Insight: Developing/Improving and Engaged  Engagement in Therapy: Developing/Improving and Engaged  Modes of Intervention: Clarification, Confrontation, Discussion, Education, Exploration, Limit-setting, Orientation, Problem-solving, Rapport Building, Dance movement psychotherapist, Socialization and Support  Summary of Progress/Problems: The topic for group today was emotional regulation. This group focused on both positive and negative emotion identification and allowed group members to process ways to identify feelings, regulate negative emotions, and find healthy ways to manage internal/external emotions. Group members were asked to reflect on a time when their reaction to an emotion led to a negative outcome and explored how alternative responses using emotion regulation would have benefited them. Group members were also asked to discuss a time when emotion regulation was utilized when a negative emotion was experienced. Patient identified distrust of others as a barrier to opening up emotionally. Patient shared that he trusts his "gut instinct" and has difficulty opening up to others as a way of protecting himself. CSW and other group members provided emotional support and encouragement.  Samuella Bruin, MSW, [employer] Clinical Social Worker [facility] [phone]

## 2014-12-26 NOTE — Progress Notes (Signed)
D: Pt denies HI, Pt +ve SI/AVH- but pt contracts for safety. Pt is pleasant and cooperative. Pt  Stated he feels better, pt wants to go to [facility] or [facility]. Pt appears to possibly be going to Tx with another pt at [facility] now, but does not elaborate about the situation. Pt continues to be flat and appears sad, but pt interacting on unit with peers and staff.   A: Pt was offered support and encouragement. Pt was given scheduled medications. Pt was encourage to attend groups. Q 15 minute checks were done for safety.   R:Pt attends groups and interacts well with peers and staff. Pt is taking medication. Pt has no complaints at this time .Pt receptive to treatment and safety maintained on unit.

## 2014-12-26 NOTE — [facility] Group Notes (Addendum)
   [facility] LCSW Aftercare Discharge Planning Group Note  12/26/2014  8:45 AM   Participation Quality: Alert, Appropriate and Oriented  Mood/Affect: Depressed and Flat  Depression Rating: 8  Anxiety Rating: Rates anxiety high today  Thoughts of Suicide: Pt endorses passive SI  Will you contract for safety? Yes  Current AVH: Pt denies  Plan for Discharge/Comments: Pt attended discharge planning group and actively participated in group. CSW provided pt with today's workbook. Patient expresses interest in [facility] and requested contact information for agency.  Transportation Means: Pt reports access to transportation  Supports: No supports mentioned at this time  Samuella Bruin, MSW, [employer] Clinical Social Worker [employer] [phone]

## 2014-12-26 NOTE — Progress Notes (Signed)
D: Pt presents with anxiety. Per patient self inventory form pt reports he slept good last night with the use of sleep medication. He reports a good appetite, low energy level, poor concentration. He rates depression 5 1/2, hopelessness 10/10, anxiety 7/10- all on 0-10 scale, 10 being the worse. Pt endorses passive SI, but is able to verbally contract for safety. Pt c/o generalized pain. Pt denies HI, Denies AVH. Pt reports his goal is " getting my meds good." and he will "see doctor" to help meet his goal. Pt noted socializing with peers on the unit. Flirtatious behavior at times towards nursing staff.  A:Special checks q 15 mins in place for safety. Medication administered per MD order(see eMAR). Encouragement and support provided. PRN pain medication given for c/o pain.  R:Safety maintained. Compliant with medication regimen. Will continue to monitor.

## 2014-12-26 NOTE — Plan of Care (Signed)
Problem: Diagnosis: Increased Risk For Suicide Attempt Goal: LTG-Patient Will Report Improved Mood and Deny Suicidal LTG (by discharge) Patient will report improved mood and deny suicidal ideation.  Outcome: Not Progressing Pt stated he felt bad earlier and is +ve SI-but contracts for safety  Problem: Alteration in mood Goal: LTG-Patient reports reduction in suicidal thoughts (Patient reports reduction in suicidal thoughts and is able to verbalize a safety plan for whenever patient is feeling suicidal)  Outcome: Not Progressing Pt SI-but contracts

## 2014-12-27 ENCOUNTER — Observation Stay (HOSPITAL_COMMUNITY)
Admission: AD | Admit: 2014-12-27 | Discharge: 2014-12-29 | Disposition: A | Discharge status: Other Acute Inpatient Hospital | Payer: Self-pay | Source: Other Acute Inpatient Hospital | Attending: Internal Medicine | Admitting: Internal Medicine

## 2014-12-27 ENCOUNTER — Emergency Department (HOSPITAL_COMMUNITY): Admission: EM | Admit: 2014-12-27 | Discharge: 2014-12-27 | Discharge status: Absent without leave | Payer: Self-pay

## 2014-12-27 ENCOUNTER — Encounter (HOSPITAL_COMMUNITY): Payer: Self-pay

## 2014-12-27 ENCOUNTER — Encounter (HOSPITAL_COMMUNITY): Payer: Self-pay | Admitting: Emergency Medicine

## 2014-12-27 DIAGNOSIS — K922 Gastrointestinal hemorrhage, unspecified: Secondary | ICD-10-CM | POA: Diagnosis present

## 2014-12-27 DIAGNOSIS — Z720 Tobacco use: Secondary | ICD-10-CM | POA: Diagnosis present

## 2014-12-27 DIAGNOSIS — K920 Hematemesis: Principal | ICD-10-CM | POA: Diagnosis present

## 2014-12-27 DIAGNOSIS — R45851 Suicidal ideations: Secondary | ICD-10-CM

## 2014-12-27 DIAGNOSIS — K209 Esophagitis, unspecified without bleeding: Secondary | ICD-10-CM | POA: Diagnosis present

## 2014-12-27 DIAGNOSIS — M25511 Pain in right shoulder: Secondary | ICD-10-CM | POA: Insufficient documentation

## 2014-12-27 DIAGNOSIS — F1994 Other psychoactive substance use, unspecified with psychoactive substance-induced mood disorder: Secondary | ICD-10-CM | POA: Diagnosis present

## 2014-12-27 DIAGNOSIS — F32A Depression, unspecified: Secondary | ICD-10-CM | POA: Diagnosis present

## 2014-12-27 DIAGNOSIS — G8929 Other chronic pain: Secondary | ICD-10-CM | POA: Insufficient documentation

## 2014-12-27 DIAGNOSIS — F4310 Post-traumatic stress disorder, unspecified: Secondary | ICD-10-CM | POA: Diagnosis present

## 2014-12-27 DIAGNOSIS — R110 Nausea: Secondary | ICD-10-CM

## 2014-12-27 DIAGNOSIS — K449 Diaphragmatic hernia without obstruction or gangrene: Secondary | ICD-10-CM | POA: Insufficient documentation

## 2014-12-27 DIAGNOSIS — F1010 Alcohol abuse, uncomplicated: Secondary | ICD-10-CM | POA: Diagnosis present

## 2014-12-27 DIAGNOSIS — G629 Polyneuropathy, unspecified: Secondary | ICD-10-CM

## 2014-12-27 DIAGNOSIS — F329 Major depressive disorder, single episode, unspecified: Secondary | ICD-10-CM | POA: Insufficient documentation

## 2014-12-27 DIAGNOSIS — F419 Anxiety disorder, unspecified: Secondary | ICD-10-CM | POA: Insufficient documentation

## 2014-12-27 DIAGNOSIS — F1910 Other psychoactive substance abuse, uncomplicated: Secondary | ICD-10-CM | POA: Diagnosis present

## 2014-12-27 DIAGNOSIS — F17210 Nicotine dependence, cigarettes, uncomplicated: Secondary | ICD-10-CM | POA: Insufficient documentation

## 2014-12-27 HISTORY — DX: Alcohol abuse, uncomplicated: F10.10

## 2014-12-27 LAB — OCCULT BLOOD X 1 CARD TO LAB, STOOL: FECAL OCCULT BLD: NEGATIVE

## 2014-12-27 LAB — COMPREHENSIVE METABOLIC PANEL
ALBUMIN: 3.8 g/dL (ref 3.5–5.0)
ALT: 31 U/L (ref 17–63)
AST: 39 U/L (ref 15–41)
Alkaline Phosphatase: 45 U/L (ref 38–126)
Anion gap: 5 (ref 5–15)
BILIRUBIN TOTAL: 0.2 mg/dL — AB (ref 0.3–1.2)
BUN: 16 mg/dL (ref 6–20)
CHLORIDE: 103 mmol/L (ref 101–111)
CO2: 30 mmol/L (ref 22–32)
Calcium: 9 mg/dL (ref 8.9–10.3)
Creatinine, Ser: 0.9 mg/dL (ref 0.61–1.24)
GFR calc Af Amer: >60 mL/min (ref >60)
GFR calc non Af Amer: >60 mL/min (ref >60)
GLUCOSE: 92 mg/dL (ref 65–99)
POTASSIUM: 4 mmol/L (ref 3.5–5.1)
Sodium: 138 mmol/L (ref 135–145)
TOTAL PROTEIN: 6.3 g/dL — AB (ref 6.5–8.1)

## 2014-12-27 LAB — I-STAT CG4 LACTIC ACID, ED: Lactic Acid, Venous: 0.7 mmol/L (ref 0.5–2.0)

## 2014-12-27 LAB — CBC WITH DIFFERENTIAL/PLATELET
Basophils Absolute: 0 10*3/uL (ref 0.0–0.1)
Basophils Relative: 1 %
Eosinophils Absolute: 0.1 10*3/uL (ref 0.0–0.7)
Eosinophils Relative: 1 %
HEMATOCRIT: 38.9 % — AB (ref 39.0–52.0)
Hemoglobin: 12.6 g/dL — ABNORMAL LOW (ref 13.0–17.0)
LYMPHS PCT: 16 %
Lymphs Abs: 0.9 10*3/uL (ref 0.7–4.0)
MCH: 30.1 pg (ref 26.0–34.0)
MCHC: 32.4 g/dL (ref 30.0–36.0)
MCV: 93.1 fL (ref 78.0–100.0)
MONO ABS: 0.5 10*3/uL (ref 0.1–1.0)
MONOS PCT: 8 %
NEUTROS ABS: 4.4 10*3/uL (ref 1.7–7.7)
Neutrophils Relative %: 74 %
Platelets: 126 10*3/uL — ABNORMAL LOW (ref 150–400)
RBC: 4.18 MIL/uL — ABNORMAL LOW (ref 4.22–5.81)
RDW: 15.7 % — AB (ref 11.5–15.5)
WBC: 5.9 10*3/uL (ref 4.0–10.5)

## 2014-12-27 LAB — TYPE AND SCREEN
ABO/RH(D): O POS
Antibody Screen: NEGATIVE

## 2014-12-27 LAB — ABO/RH: ABO/RH(D): O POS

## 2014-12-27 LAB — HEPATITIS PANEL, ACUTE
HCV Ab: <0.1 s/co ratio (ref 0.0–0.9)
HEP A IGM: NEGATIVE
HEP B C IGM: NEGATIVE
HEP B S AG: NEGATIVE

## 2014-12-27 LAB — HEMATOCRIT: HEMATOCRIT: 39.5 % (ref 39.0–52.0)

## 2014-12-27 LAB — LIPASE, BLOOD: Lipase: 36 U/L (ref 22–51)

## 2014-12-27 LAB — HEMOGLOBIN: Hemoglobin: 12.8 g/dL — ABNORMAL LOW (ref 13.0–17.0)

## 2014-12-27 MED ORDER — MORPHINE SULFATE (PF) 2 MG/ML IV SOLN
1.0000 mg | INTRAVENOUS | Status: DC | PRN
  Administered 2014-12-27 – 2014-12-29 (×8): 1 mg via INTRAVENOUS
  Filled 2014-12-27 (×8): qty 1

## 2014-12-27 MED ORDER — QUETIAPINE FUMARATE 50 MG PO TABS
150.0000 mg | ORAL_TABLET | Freq: Every day | ORAL | Status: DC
  Administered 2014-12-27 – 2014-12-28 (×2): 150 mg via ORAL
  Filled 2014-12-27 (×3): qty 1

## 2014-12-27 MED ORDER — METHOCARBAMOL 750 MG PO TABS
750.0000 mg | ORAL_TABLET | Freq: Four times a day (QID) | ORAL | Status: DC
  Administered 2014-12-27 – 2014-12-29 (×10): 750 mg via ORAL
  Filled 2014-12-27 (×11): qty 1

## 2014-12-27 MED ORDER — QUETIAPINE FUMARATE 50 MG PO TABS
150.0000 mg | ORAL_TABLET | Freq: Three times a day (TID) | ORAL | Status: DC
  Filled 2014-12-27 (×3): qty 1

## 2014-12-27 MED ORDER — PANTOPRAZOLE SODIUM 40 MG IV SOLR
8.0000 mg/h | INTRAVENOUS | Status: DC
  Administered 2014-12-27: 8 mg/h via INTRAVENOUS
  Filled 2014-12-27: qty 80

## 2014-12-27 MED ORDER — TRAZODONE HCL 50 MG PO TABS
50.0000 mg | ORAL_TABLET | Freq: Every day | ORAL | Status: DC
  Administered 2014-12-27 – 2014-12-28 (×2): 50 mg via ORAL
  Filled 2014-12-27 (×4): qty 1

## 2014-12-27 MED ORDER — PANTOPRAZOLE SODIUM 40 MG IV SOLR
40.0000 mg | INTRAVENOUS | Status: DC
  Filled 2014-12-27: qty 40

## 2014-12-27 MED ORDER — BUPROPION HCL ER (XL) 300 MG PO TB24
300.0000 mg | ORAL_TABLET | Freq: Every day | ORAL | Status: DC
  Administered 2014-12-27 – 2014-12-29 (×3): 300 mg via ORAL
  Filled 2014-12-27 (×3): qty 1

## 2014-12-27 MED ORDER — METOCLOPRAMIDE HCL 5 MG/ML IJ SOLN
10.0000 mg | Freq: Once | INTRAMUSCULAR | Status: AC
  Administered 2014-12-27: 10 mg via INTRAVENOUS
  Filled 2014-12-27: qty 2

## 2014-12-27 MED ORDER — GABAPENTIN 300 MG PO CAPS
600.0000 mg | ORAL_CAPSULE | Freq: Three times a day (TID) | ORAL | Status: DC
  Administered 2014-12-27 – 2014-12-29 (×9): 600 mg via ORAL
  Filled 2014-12-27 (×11): qty 2

## 2014-12-27 MED ORDER — DIPHENHYDRAMINE HCL 50 MG/ML IJ SOLN
25.0000 mg | Freq: Once | INTRAMUSCULAR | Status: AC
  Administered 2014-12-27: 25 mg via INTRAVENOUS
  Filled 2014-12-27: qty 1

## 2014-12-27 MED ORDER — QUETIAPINE FUMARATE 100 MG PO TABS
100.0000 mg | ORAL_TABLET | Freq: Three times a day (TID) | ORAL | Status: DC
  Administered 2014-12-27 – 2014-12-29 (×7): 100 mg via ORAL
  Filled 2014-12-27 (×8): qty 1

## 2014-12-27 MED ORDER — SODIUM CHLORIDE 0.9 % IV SOLN
1000.0000 mL | INTRAVENOUS | Status: DC
  Administered 2014-12-27: 1000 mL via INTRAVENOUS

## 2014-12-27 MED ORDER — ONDANSETRON HCL 4 MG/2ML IJ SOLN: 4.0000 mg | Freq: Four times a day (QID) | INTRAMUSCULAR | Status: DC | PRN

## 2014-12-27 MED ORDER — SODIUM CHLORIDE 0.9 % IV SOLN
80.0000 mg | Freq: Once | INTRAVENOUS | Status: DC
  Administered 2014-12-27: 80 mg via INTRAVENOUS
  Filled 2014-12-27: qty 80

## 2014-12-27 MED ORDER — SODIUM CHLORIDE 0.9 % IV SOLN
1000.0000 mL | Freq: Once | INTRAVENOUS | Status: AC
  Administered 2014-12-27: 1000 mL via INTRAVENOUS

## 2014-12-27 MED ORDER — SODIUM CHLORIDE 0.9 % IV SOLN
INTRAVENOUS | Status: DC
  Administered 2014-12-27 – 2014-12-28 (×3): via INTRAVENOUS

## 2014-12-27 MED ORDER — ONDANSETRON HCL 4 MG PO TABS: 4.0000 mg | ORAL_TABLET | Freq: Four times a day (QID) | ORAL | Status: DC | PRN

## 2014-12-27 MED ORDER — PANTOPRAZOLE SODIUM 40 MG IV SOLR
40.0000 mg | INTRAVENOUS | Status: DC
  Administered 2014-12-27: 40 mg via INTRAVENOUS
  Filled 2014-12-27 (×2): qty 40

## 2014-12-27 MED ORDER — NICOTINE 21 MG/24HR TD PT24
21.0000 mg | MEDICATED_PATCH | Freq: Every day | TRANSDERMAL | Status: DC
  Administered 2014-12-27 – 2014-12-29 (×3): 21 mg via TRANSDERMAL
  Filled 2014-12-27 (×3): qty 1

## 2014-12-27 MED ORDER — PANTOPRAZOLE SODIUM 40 MG IV SOLR: 40.0000 mg | Freq: Two times a day (BID) | INTRAVENOUS | Status: DC

## 2014-12-27 MED ORDER — NICOTINE 21 MG/24HR TD PT24
21.0000 mg | MEDICATED_PATCH | Freq: Once | TRANSDERMAL | Status: DC
  Administered 2014-12-27: 21 mg via TRANSDERMAL
  Filled 2014-12-27: qty 1

## 2014-12-27 MED ORDER — NICOTINE 21 MG/24HR TD PT24
MEDICATED_PATCH | TRANSDERMAL | Status: AC
  Filled 2014-12-27: qty 1

## 2014-12-27 MED ORDER — CYCLOBENZAPRINE HCL 10 MG PO TABS
10.0000 mg | ORAL_TABLET | Freq: Once | ORAL | Status: AC
  Administered 2014-12-27: 10 mg via ORAL
  Filled 2014-12-27: qty 1

## 2014-12-27 MED ORDER — KETOROLAC TROMETHAMINE 30 MG/ML IJ SOLN
30.0000 mg | Freq: Once | INTRAMUSCULAR | Status: AC
  Administered 2014-12-27: 30 mg via INTRAVENOUS
  Filled 2014-12-27: qty 1

## 2014-12-27 NOTE — ED Notes (Signed)
From previous notes, rn notes that pt came to ED on 9/16 for ETOH detox and SI. Pt reported he jumped off a 2 story building in attempt to kill himself. Reported pain to right shoulder, all scans negative. Pt went to [facility] unit bed 307 on 9/16, pt has been there till today. Today 9/22 pt woke up vomiting coffee ground emesis at 0425.   9/16 14.1 hemoglobin 9/22 12.6 hemoglobin  At present pt denies SI/HI, reports he sometimes "talks to people who are not there". Pt slurs/mumbles words, is difficult to understand what he is saying at times. Pt does answer questions appropriately when directly asked. No vomiting noted, pt requesting food. Pt was given small amount of ice chips upon request. No food as been given to pt. Pt reports body pain, right shoulder and right knee pain 7/10.

## 2014-12-27 NOTE — ED Notes (Signed)
Report given to [place], RN

## 2014-12-27 NOTE — ED Provider Notes (Signed)
CSN: [id number]     Arrival date & time 12/27/14  0455 History   First MD Initiated Contact with Patient 12/27/14 [phone]     Chief Complaint  Patient presents with  . Rectal Bleeding     (Consider location/radiation/quality/duration/timing/severity/associated sxs/prior Treatment) Patient is a 34 y.o. male presenting with hematochezia. The history is provided by the patient.  Rectal Bleeding He is asked a complaining of vomiting blood. He states that he woke up and vomited coffee-ground material. He continues to have dry heaves. There is generalized abdominal cramping. He feels dizzy every time he sits up. [facility] because of the possible bleeding and he was noted to be tachycardic with heart rate of 114. At that time, blood pressure was 131/90. He states he does have a history of a bleeding ulcer. He felt fine when he went to bed.  History reviewed. No pertinent past medical history. History reviewed. No pertinent past surgical history. History reviewed. No pertinent family history. Social History  Substance Use Topics  . Smoking status: Current Every Day Smoker    Types: Cigarettes  . Smokeless tobacco: None  . Alcohol Use: Yes    Review of Systems  Gastrointestinal: Positive for hematochezia.  All other systems reviewed and are negative.     Allergies  Review of patient's allergies indicates no known allergies.  Home Medications   Prior to Admission medications   Medication Sig Start Date End Date Taking? Authorizing Provider  ibuprofen (ADVIL,MOTRIN) 400 MG tablet Take 800 mg by mouth every 6 (six) hours as needed for mild pain.    Historical Provider, MD   BP 128/90 mmHg  Pulse 78  Temp(Src) 97.6 F (36.4 C) (Oral)  Resp 18  Ht 5\' 6"  (1.676 m)  Wt 137 lb (62.143 kg)  BMI 22.12 kg/m2  SpO2 99% Physical Exam  Nursing note and vitals reviewed.  34 year old male, resting comfortably and in no acute distress. Vital signs  are significant for borderline hypertension. Oxygen saturation is 99%, which is normal. Head is normocephalic and atraumatic. PERRLA, EOMI. Oropharynx is clear. Neck is nontender and supple without adenopathy or JVD. Back is nontender and there is no CVA tenderness. Lungs are clear without rales, wheezes, or rhonchi. Chest is nontender. Heart has regular rate and rhythm without murmur. Abdomen is soft, flat, with mild tenderness diffusely. There is no rebound or guarding. There are no  masses or hepatosplenomegaly and peristalsis is normoactive. Extremities have no cyanosis or edema, full range of motion is present. Skin is warm and dry without rash. Neurologic: Mental status is normal, cranial nerves are intact, there are no motor or sensory deficits.   ED Course  Procedures (including critical care time) Labs Review Results for orders placed or performed during the hospital encounter of 12/21/14  HIV antibody  Result Value Ref Range   HIV Screen 4th Generation wRfx Non Reactive Non Reactive  RPR  Result Value Ref Range   RPR Ser Ql Non Reactive Non Reactive  Urinalysis, Routine w reflex microscopic (not at [facility])  Result Value Ref Range   Color, Urine YELLOW YELLOW   APPearance CLEAR CLEAR   Specific Gravity, Urine 1.018 1.005 - 1.030   pH 6.5 5.0 - 8.0   Glucose, UA NEGATIVE NEGATIVE mg/dL   Hgb urine dipstick NEGATIVE NEGATIVE   Bilirubin Urine NEGATIVE NEGATIVE   Ketones, ur NEGATIVE NEGATIVE mg/dL   Protein, ur NEGATIVE NEGATIVE mg/dL   Urobilinogen, UA 0.2  0.0 - 1.0 mg/dL   Nitrite NEGATIVE NEGATIVE   Leukocytes, UA NEGATIVE NEGATIVE  Comprehensive metabolic panel  Result Value Ref Range   Sodium 138 135 - 145 mmol/L   Potassium 4.0 3.5 - 5.1 mmol/L   Chloride 103 101 - 111 mmol/L   CO2 30 22 - 32 mmol/L   Glucose, Bld 92 65 - 99 mg/dL   BUN 16 6 - 20 mg/dL   Creatinine, Ser 1.61 0.61 - 1.24 mg/dL   Calcium 9.0 8.9 - 09.6 mg/dL   Total Protein 6.3 (L) 6.5 - 8.1  g/dL   Albumin 3.8 3.5 - 5.0 g/dL   AST 39 15 - 41 U/L   ALT 31 17 - 63 U/L   Alkaline Phosphatase 45 38 - 126 U/L   Total Bilirubin 0.2 (L) 0.3 - 1.2 mg/dL   GFR calc non Af Amer >60 >60 mL/min   GFR calc Af Amer >60 >60 mL/min   Anion gap 5 5 - 15  Lipase, blood  Result Value Ref Range   Lipase 36 22 - 51 U/L  CBC with Differential  Result Value Ref Range   WBC 5.9 4.0 - 10.5 K/uL   RBC 4.18 (L) 4.22 - 5.81 MIL/uL   Hemoglobin 12.6 (L) 13.0 - 17.0 g/dL   HCT 04.5 (L) 40.9 - 81.1 %   MCV 93.1 78.0 - 100.0 fL   MCH 30.1 26.0 - 34.0 pg   MCHC 32.4 30.0 - 36.0 g/dL   RDW 91.4 (H) 78.2 - 95.6 %   Platelets 126 (L) 150 - 400 K/uL   Neutrophils Relative % 74 %   Neutro Abs 4.4 1.7 - 7.7 K/uL   Lymphocytes Relative 16 %   Lymphs Abs 0.9 0.7 - 4.0 K/uL   Monocytes Relative 8 %   Monocytes Absolute 0.5 0.1 - 1.0 K/uL   Eosinophils Relative 1 %   Eosinophils Absolute 0.1 0.0 - 0.7 K/uL   Basophils Relative 1 %   Basophils Absolute 0.0 0.0 - 0.1 K/uL  I-Stat CG4 Lactic Acid, ED  Result Value Ref Range   Lactic Acid, Venous 0.70 0.5 - 2.0 mmol/L  Type and screen  Result Value Ref Range   ABO/RH(D) O POS    Antibody Screen NEG    Sample Expiration 12/30/2014   ABO/Rh  Result Value Ref Range   ABO/RH(D) O POS   GC/Chlamydia probe amp ([place])not at [facility]  Result Value Ref Range   Chlamydia Negative    Neisseria gonorrhea Negative    Dg Thoracic Spine 2 View  12/21/2014   CLINICAL DATA:  Dorsalgia following fall  EXAM: THORACIC SPINE 3 VIEWS  COMPARISON:  None.  FINDINGS: Frontal, lateral, and swimmer's views obtained. There is no fracture or spondylolisthesis. Disc spaces appear intact. No erosive change.  IMPRESSION: No fracture or spondylolisthesis.  No appreciable arthropathy.   Electronically Signed   By: Bretta Bang III M.D.   On: 12/21/2014 10:01   Dg Shoulder Right  12/21/2014   CLINICAL DATA:  Fall, proximal humerus pain  EXAM: RIGHT SHOULDER - 2+ VIEW   COMPARISON:  12/04/2014  FINDINGS: There is no evidence of fracture or dislocation. There is no evidence of arthropathy or other focal bone abnormality. Soft tissues are unremarkable. Incidental sclerotic bone island in the right humerus head.  IMPRESSION: No acute osseous finding.   Electronically Signed   By: Judie Petit.  Shick M.D.   On: 12/21/2014 08:52   Dg Shoulder Right  12/04/2014  CLINICAL DATA:  Possible fall, with right shoulder dislocation and reduction per patient. Right anterior shoulder pain. Initial encounter.  EXAM: RIGHT SHOULDER - 2+ VIEW  COMPARISON:  None.  FINDINGS: There is no evidence of fracture or dislocation. The right humeral head is seated within the glenoid fossa. No Hill-Sachs or osseous Bankart lesion is seen. The acromioclavicular joint is unremarkable in appearance. No significant soft tissue abnormalities are seen. The visualized portions of the right lung are clear.  IMPRESSION: No evidence of fracture or dislocation.   Electronically Signed   By: Roanna Raider M.D.   On: 12/04/2014 02:06   Ct Head Wo Contrast  12/21/2014   CLINICAL DATA:  Pain after jumping from second story balcony two days prior  EXAM: CT HEAD WITHOUT CONTRAST  CT CERVICAL SPINE WITHOUT CONTRAST  TECHNIQUE: Multidetector CT imaging of the head and cervical spine was performed following the standard protocol without intravenous contrast. Multiplanar CT image reconstructions of the cervical spine were also generated.  COMPARISON:  Head CT December 12, 2005  FINDINGS: CT HEAD FINDINGS  The ventricles are normal in size and configuration. There is no intracranial mass hemorrhage, extra-axial fluid collection, or midline shift. Gray-white compartments are normal. No acute infarct evident. Bony calvarium appears intact. The mastoid air cells are clear. There are old fractures of the left nasal bone and medial left orbital wall.  CT CERVICAL SPINE FINDINGS  There is no fracture or spondylolisthesis. Prevertebral soft  tissues and predental space regions are normal. There is no appreciable disc space narrowing. There is a calcified right paracentral disc protrusion at C3-4, not causing nerve root edema or effacement. There is mild exit foraminal narrowing at C3-4 and C4-5 due to bony hypertrophy. No disc extrusion or stenosis.  IMPRESSION: CT head: Old fractures of the left nasal bone and medial left orbital wall. No acute fracture evident. No intracranial mass, hemorrhage, or extra-axial fluid collection. Gray-white compartments appear normal.  CT cervical spine: Mild osteoarthritic change. No fracture or spondylolisthesis.   Electronically Signed   By: Bretta Bang III M.D.   On: 12/21/2014 09:12   Ct Cervical Spine Wo Contrast  12/21/2014   CLINICAL DATA:  Pain after jumping from second story balcony two days prior  EXAM: CT HEAD WITHOUT CONTRAST  CT CERVICAL SPINE WITHOUT CONTRAST  TECHNIQUE: Multidetector CT imaging of the head and cervical spine was performed following the standard protocol without intravenous contrast. Multiplanar CT image reconstructions of the cervical spine were also generated.  COMPARISON:  Head CT December 12, 2005  FINDINGS: CT HEAD FINDINGS  The ventricles are normal in size and configuration. There is no intracranial mass hemorrhage, extra-axial fluid collection, or midline shift. Gray-white compartments are normal. No acute infarct evident. Bony calvarium appears intact. The mastoid air cells are clear. There are old fractures of the left nasal bone and medial left orbital wall.  CT CERVICAL SPINE FINDINGS  There is no fracture or spondylolisthesis. Prevertebral soft tissues and predental space regions are normal. There is no appreciable disc space narrowing. There is a calcified right paracentral disc protrusion at C3-4, not causing nerve root edema or effacement. There is mild exit foraminal narrowing at C3-4 and C4-5 due to bony hypertrophy. No disc extrusion or stenosis.  IMPRESSION: CT  head: Old fractures of the left nasal bone and medial left orbital wall. No acute fracture evident. No intracranial mass, hemorrhage, or extra-axial fluid collection. Gray-white compartments appear normal.  CT cervical spine: Mild osteoarthritic change. No  fracture or spondylolisthesis.   Electronically Signed   By: Bretta Bang III M.D.   On: 12/21/2014 09:12   Dg Humerus Right  12/04/2014   CLINICAL DATA:  Possible fall. Anterior right shoulder pain. Status post reduction of right shoulder, per patient. Initial encounter.  EXAM: RIGHT HUMERUS - 2+ VIEW  COMPARISON:  None.  FINDINGS: There is no evidence of fracture or dislocation. The right humerus appears grossly intact. The right humeral head remains seated at the glenoid fossa. The right acromioclavicular joint is unremarkable in appearance.  The visualized portions of the right lung are clear. No definite soft tissue abnormalities are characterized on radiograph.  IMPRESSION: No evidence of fracture or dislocation.   Electronically Signed   By: Roanna Raider M.D.   On: 12/04/2014 02:05    I have personally reviewed and evaluated these lab results as part of my medical decision-making.   MDM   Final diagnoses:  Upper gastrointestinal bleeding    history of possible hematemesis. Heart rate is now in the mid 70s and blood pressure is normal. Will check CBC and orthostatic vital signs.  Orthostatic vital signs did not show significant drop in blood pressure or rise in pulse, although, there is some ice in pulse and drop in blood pressure. He did vomit once in the ED without any gross blood or coffee-ground material. Hemoglobin has shown a significant drop from 6 days ago indicating there has been some bleeding. With lack of blood in his emesis, it is possible that he had a self limited bleed which has stopped already. Case is discussed with Dr. Rito Ehrlich of [facility] who agrees to admit the patient under observation status.  Dione Booze, MD 12/27/14 [id number]

## 2014-12-27 NOTE — H&P (Signed)
History and Physical  [name] ZOX:[id number] DOB: 1980-08-16 DOA: 12/27/2014  Referring physician: Dione Booze, ER physician PCP: No PCP Per Patient   Chief Complaint: Hematemesis  HPI: [name] is a 34 y.o. male  Past medical history of chronic right shoulder pain, polysubstance abuse who has [facility] for depression and suicidal ideations for the past 4 days and then this morning had an episode of large hematemesis. Brought into the emergency room where he was noted to have stable vitals including blood pressure and heart rate, but hemoglobin which had been 14.1 on 9/16 had dropped to 12.6. Patient was complaining of abdominal discomfort. He has been on NSAIDs at home which she takes frequently for his right shoulder. He states he is a previous history of a gastric ulcer Hospitalists were called for further evaluation.   Review of Systems:  Patient seen after arrival to floor. Pt complains of chronic right shoulder pain, mild abdominal discomfort, dry mouth. Just complains of lower extremity numbness from chronic neuropathy  Pt denies any headaches, vision changes, dysphagia, chest pain, palpitations, shortness of breath, wheeze, cough, hematuria, dysuria, constipation, diarrhea, focal extremity numbness weakness or pain other than his chronic right shoulder.  Review of systems are otherwise negative  Past Medical History  Diagnosis Date  . Testicular torsion   . Alcohol abuse    Past Surgical History  Procedure Laterality Date  . Right arm surgery    . Surgery scrotal / testicular     Social History:  reports that he has been smoking Cigarettes.  He has never used smokeless tobacco. He reports that he drinks alcohol. He reports that he uses illicit drugs (Marijuana). Patient lives at home by himself & is able to participate in activities of daily living without assistance  No Known Allergies  Family history: Mother with  fibromyalgia  Prior to Admission medications   Seroquel 100 mg by mouth 3 times a day +150 daily at bedtime Neurontin 600 by mouth 4 times a day Well beautician 300 mg by mouth daily Robaxin 754 times a day     Physical Exam: BP 132/92 mmHg  Pulse 84  Temp(Src) 97.8 F (36.6 C) (Oral)  Resp 18  SpO2 100%  General:  Alert and oriented 3, affect muted Eyes: Sclera nonicteric, extraocular movements are intact ENT: Normocephalic, atraumatic, mucous murmurs are dry Neck: No JVD Cardiovascular: Regular rate and rhythm, S1-S2 Respiratory: Clear to auscultation bilaterally Abdomen: Soft, nontender, nondistended, positive bowel sounds Skin: No skin breaks, tears or lesions. Multiple tattoos across his body Musculoskeletal: No clubbing or cyanosis or edema Psychiatric: Patient is appropriate although affect flattened Neurologic: No overt deficits, mild decreased sensation in feet           Labs on Admission:  Basic Metabolic Panel:  Recent Labs Lab 12/21/14 0749 12/27/14 0613  NA 136 138  K 4.1 4.0  CL 102 103  CO2 20* 30  GLUCOSE 92 92  BUN 7 16  CREATININE 0.76 0.90  CALCIUM 9.1 9.0   Liver Function Tests:  Recent Labs Lab 12/21/14 0749 12/27/14 0613  AST 56* 39  ALT 23 31  ALKPHOS 73 45  BILITOT 0.8 0.2*  PROT 6.9 6.3*  ALBUMIN 3.9 3.8    Recent Labs Lab 12/27/14 0613  LIPASE 36   No results for input(s): AMMONIA in the last 168 hours. CBC:  Recent Labs Lab 12/21/14 0749 12/27/14 0613 12/27/14 1500  WBC 4.2 5.9  --  NEUTROABS  --  4.4  --   HGB 14.1 12.6* 12.8*  HCT 41.3 38.9* 39.5  MCV 88.1 93.1  --   PLT 165 126*  --    Cardiac Enzymes: No results for input(s): CKTOTAL, CKMB, CKMBINDEX, TROPONINI in the last 168 hours.  BNP (last 3 results) No results for input(s): BNP in the last 8760 hours.  ProBNP (last 3 results) No results for input(s): PROBNP in the last 8760 hours.  CBG: No results for input(s): GLUCAP in the last 168  hours.  Radiological Exams on Admission: No results found.  EKG: Independently reviewed. Not done  Assessment/Plan Present on Admission:  . Hematemesis suspicious for GI bleed: GI consulted. Nothing by mouth and IV PPI. EGD planned for 9/23.  . Substance induced mood disorder . PTSD (post-traumatic stress disorder)/depression/suicidal: Corporate investment banker. Back to behavioral health once medically stable. Continue Seroquel  . Tobacco abuse: Nicotine patch  . Alcohol abuse: Stable. Out of the window for withdrawal  . Polysubstance abuse Peripheral neuropathy: Continue Neurontin Consultants: Elnoria Howard, GI  Code Status: Full code  Family Communication: No family present  Disposition Plan: If EGD unrevealing, could potentially go back to behavioral health tomorrow  Time spent: 40 minutes  Hollice Espy [facility] Pager [phone]

## 2014-12-27 NOTE — ED Notes (Signed)
MD at bedside. 

## 2014-12-27 NOTE — ED Notes (Signed)
md at bedside

## 2014-12-27 NOTE — Progress Notes (Signed)
Pt woke up at 0425 vomiting. On assessment pt had coffee ground emesis. Pt vital signs were WNL with pt P-114. Pt was assessed by PA and pt sent to ER

## 2014-12-27 NOTE — ED Notes (Signed)
Patient with emesis, tan in color. Patient requesting ice chips. Will notify MD.

## 2014-12-27 NOTE — ED Notes (Addendum)
Pt alert and oriented x4. Respirations even and unlabored, bilateral symmetrical rise and fall of chest. Skin warm and dry. In no acute distress. Denies needs.   Pt sometimes talking/ mumbling to himself, when asked what he is saying, pt says he is talking to people that are not there. But when asked direct questions pt answers appropriately.

## 2014-12-27 NOTE — ED Notes (Signed)
Patient states he was sent here from [facility] because he was "throwing up blood". Patient can not quantify. Patient is difficult to understand due to slight slurring of words. Patient complaining about monitoring equipment making noise. Patient was educated on monitors and why they need to be on, patient is agreeable at this time.

## 2014-12-27 NOTE — ED Notes (Signed)
[facility] notified patient to be medically admitted. Requested patient belongings to be transferred to [facility].

## 2014-12-27 NOTE — ED Notes (Signed)
Patient requesting robaxin, patient was advised this is scheduled and is not due until 8am. Patient wants pain medication. Patient advised I will speak to ER MD about pain medication. Patient then requests to know what "PRNs" do I have. Patient was advised he has tylenol and ibuprofen for pain PRN, patient was advised he may not have ibuprofen at this time due to the bleeding, again stated I would speak to ER MD.

## 2014-12-27 NOTE — Consult Note (Signed)
Reason for Consult: Hematemesis Referring Physician: [facility]  Caryn Bee Yun HPI: This is a 34 year old male with a PMH of ETOH abuse transferred from [facility] with complaints of hematemesis.  His symptoms occurred acutely and it woke him up from sleep.  He has approximately 5 episodes, but the first was the worst.  The patient remarks that he has issues with GERD at this time and there is some abdominal soreness.  In the remote past he reports another episode of hematemesis, but no work up was pursued.  He denies any recent NSAID use.  Just before my arrival he states that he had some minor melena.  His HGB is stable and he is hemodynamically stable.  Past Medical History  Diagnosis Date  . Testicular torsion   . Alcohol abuse     Past Surgical History  Procedure Laterality Date  . Right arm surgery    . Surgery scrotal / testicular      History reviewed. No pertinent family history.  Social History:  reports that he has been smoking Cigarettes.  He has never used smokeless tobacco. He reports that he drinks alcohol. He reports that he uses illicit drugs (Marijuana).  Allergies: No Known Allergies  Medications:  Scheduled: . buPROPion  300 mg Oral Daily  . gabapentin  600 mg Oral TID AC & HS  . methocarbamol  750 mg Oral QID  . nicotine  21 mg Transdermal Daily  . pantoprazole (PROTONIX) IV  40 mg Intravenous Q24H  . QUEtiapine  100 mg Oral TID WC  . QUEtiapine  150 mg Oral QHS   Continuous: . sodium chloride 100 mL/hr at 12/27/14 1420    Results for orders placed or performed during the hospital encounter of 12/27/14 (from the past 24 hour(s))  Occult blood card to lab, stool     Status: None   Collection Time: 12/27/14  2:46 PM  Result Value Ref Range   Fecal Occult Bld NEGATIVE NEGATIVE  Hematocrit     Status: None   Collection Time: 12/27/14  3:00 PM  Result Value Ref Range   HCT 39.5 39.0 - 52.0 %  Hemoglobin     Status: Abnormal   Collection Time:  12/27/14  3:00 PM  Result Value Ref Range   Hemoglobin 12.8 (L) 13.0 - 17.0 g/dL     No results found.  ROS:  As stated above in the HPI otherwise negative.  Blood pressure 132/92, pulse 84, temperature 97.8 F (36.6 C), temperature source Oral, resp. rate 18, SpO2 100 %.    PE: Gen: NAD, Alert and Oriented HEENT:  [place]/AT, EOMI Neck: Supple, no LAD Lungs: CTA Bilaterally CV: RRR without M/G/R ABM: Soft, NTND, +BS Ext: No C/C/E  Assessment/Plan: 1) Hematemesis. 2) ? ABM pain. 3) History of ETOH abuse.   I am uncertain about the source of his symptoms.  I will perform an EGD tomorrow for further evaluation.  Plan: 1) EGD in the AM.  HUNG,PATRICK D 12/27/2014, 5:45 PM

## 2014-12-27 NOTE — Significant Event (Cosign Needed)
Notified via NS, patient awoke with epigastric pain and vomiting up copius amounts of coffee ground emesis. Patient with reported history of PUD. Is denying any passing of melenotic quality stools or BRBPR. Patient is denying any ongoing anorexia or early satiety. Patient with reported hx of PUD. Patient is not on any PPI's. Patient with history of substance induced mood disorder admitted voluntarily.  O V/S: P 114, BP 131/90, R 20  A/P: Suspected UGIB (hemodynamically stable)  Transport to [facility] ED for further evaluation

## 2014-12-27 NOTE — Progress Notes (Signed)
Per PA on duty pt to be transported to [facility] due to possible GI bleed and Tachycardia. ED called and informed of pt Transfer

## 2014-12-27 NOTE — Progress Notes (Signed)
Pt's belongings were collected from bedside and locker. Given to security for transport to [facility].

## 2014-12-28 ENCOUNTER — Encounter (HOSPITAL_COMMUNITY)
Admission: AD | Disposition: A | Discharge status: Other Acute Inpatient Hospital | Payer: Self-pay | Attending: Internal Medicine

## 2014-12-28 ENCOUNTER — Encounter (HOSPITAL_COMMUNITY): Payer: Self-pay

## 2014-12-28 DIAGNOSIS — K209 Esophagitis, unspecified without bleeding: Secondary | ICD-10-CM | POA: Diagnosis present

## 2014-12-28 DIAGNOSIS — R45851 Suicidal ideations: Secondary | ICD-10-CM

## 2014-12-28 DIAGNOSIS — Z720 Tobacco use: Secondary | ICD-10-CM | POA: Diagnosis present

## 2014-12-28 DIAGNOSIS — F32A Depression, unspecified: Secondary | ICD-10-CM | POA: Diagnosis present

## 2014-12-28 DIAGNOSIS — F1994 Other psychoactive substance use, unspecified with psychoactive substance-induced mood disorder: Secondary | ICD-10-CM | POA: Diagnosis not present

## 2014-12-28 DIAGNOSIS — K922 Gastrointestinal hemorrhage, unspecified: Secondary | ICD-10-CM

## 2014-12-28 DIAGNOSIS — F329 Major depressive disorder, single episode, unspecified: Secondary | ICD-10-CM

## 2014-12-28 HISTORY — PX: ESOPHAGOGASTRODUODENOSCOPY: SHX5428

## 2014-12-28 LAB — BASIC METABOLIC PANEL
Anion gap: 7 (ref 5–15)
BUN: 7 mg/dL (ref 6–20)
CHLORIDE: 106 mmol/L (ref 101–111)
CO2: 28 mmol/L (ref 22–32)
CREATININE: 0.82 mg/dL (ref 0.61–1.24)
Calcium: 8.6 mg/dL — ABNORMAL LOW (ref 8.9–10.3)
GFR calc Af Amer: >60 mL/min (ref >60)
GFR calc non Af Amer: >60 mL/min (ref >60)
GLUCOSE: 93 mg/dL (ref 65–99)
Potassium: 4.5 mmol/L (ref 3.5–5.1)
SODIUM: 141 mmol/L (ref 135–145)

## 2014-12-28 LAB — CBC
HCT: 39.4 % (ref 39.0–52.0)
Hemoglobin: 12.4 g/dL — ABNORMAL LOW (ref 13.0–17.0)
MCH: 29.8 pg (ref 26.0–34.0)
MCHC: 31.5 g/dL (ref 30.0–36.0)
MCV: 94.7 fL (ref 78.0–100.0)
PLATELETS: 134 10*3/uL — AB (ref 150–400)
RBC: 4.16 MIL/uL — ABNORMAL LOW (ref 4.22–5.81)
RDW: 15.9 % — AB (ref 11.5–15.5)
WBC: 4.3 10*3/uL (ref 4.0–10.5)

## 2014-12-28 SURGERY — EGD (ESOPHAGOGASTRODUODENOSCOPY)
Anesthesia: Moderate Sedation

## 2014-12-28 MED ORDER — FENTANYL CITRATE (PF) 100 MCG/2ML IJ SOLN
INTRAMUSCULAR | Status: AC
  Filled 2014-12-28: qty 4

## 2014-12-28 MED ORDER — SODIUM CHLORIDE 0.9 % IV SOLN
INTRAVENOUS | Status: DC
  Administered 2014-12-28: 500 mL via INTRAVENOUS

## 2014-12-28 MED ORDER — LORAZEPAM 2 MG/ML IJ SOLN
1.0000 mg | Freq: Three times a day (TID) | INTRAMUSCULAR | Status: DC | PRN
  Administered 2014-12-28 – 2014-12-29 (×2): 1 mg via INTRAVENOUS
  Filled 2014-12-28 (×2): qty 1

## 2014-12-28 MED ORDER — MIDAZOLAM HCL 10 MG/2ML IJ SOLN
INTRAMUSCULAR | Status: DC | PRN
  Administered 2014-12-28 (×3): 2 mg via INTRAVENOUS

## 2014-12-28 MED ORDER — DIPHENHYDRAMINE HCL 50 MG/ML IJ SOLN
INTRAMUSCULAR | Status: DC | PRN
  Administered 2014-12-28: 50 mg via INTRAVENOUS

## 2014-12-28 MED ORDER — PANTOPRAZOLE SODIUM 40 MG PO TBEC: 40.0000 mg | DELAYED_RELEASE_TABLET | Freq: Every day | ORAL | Status: DC

## 2014-12-28 MED ORDER — PANTOPRAZOLE SODIUM 40 MG PO TBEC
40.0000 mg | DELAYED_RELEASE_TABLET | Freq: Every day | ORAL | Status: DC
  Administered 2014-12-29: 40 mg via ORAL
  Filled 2014-12-28: qty 1

## 2014-12-28 MED ORDER — GI COCKTAIL ~~LOC~~
30.0000 mL | Freq: Once | ORAL | Status: AC
  Administered 2014-12-28: 30 mL via ORAL
  Filled 2014-12-28: qty 30

## 2014-12-28 MED ORDER — MIDAZOLAM HCL 5 MG/ML IJ SOLN
INTRAMUSCULAR | Status: AC
  Filled 2014-12-28: qty 3

## 2014-12-28 MED ORDER — DIPHENHYDRAMINE HCL 50 MG/ML IJ SOLN
INTRAMUSCULAR | Status: AC
  Filled 2014-12-28: qty 1

## 2014-12-28 MED ORDER — FENTANYL CITRATE (PF) 100 MCG/2ML IJ SOLN
INTRAMUSCULAR | Status: DC | PRN
  Administered 2014-12-28 (×2): 25 ug via INTRAVENOUS

## 2014-12-28 MED ORDER — PANTOPRAZOLE SODIUM 40 MG IV SOLR
40.0000 mg | Freq: Every day | INTRAVENOUS | Status: DC
  Administered 2014-12-28: 40 mg via INTRAVENOUS
  Filled 2014-12-28 (×2): qty 40

## 2014-12-28 MED ORDER — BUTAMBEN-TETRACAINE-BENZOCAINE 2-2-14 % EX AERO
INHALATION_SPRAY | CUTANEOUS | Status: DC | PRN
  Administered 2014-12-28: 2 via TOPICAL

## 2014-12-28 NOTE — Progress Notes (Signed)
   12/28/14 1800  Clinical Encounter Type  Visited With Patient  Visit Type Follow-up;Social support  Referral From Nurse  Consult/Referral To None  Recommendations Cousneling intern will continue to provide emotional support  Spiritual Encounters  Spiritual Needs Emotional  Stress Factors  Patient Stress Factors Exhausted;Major life changes  Family Stress Factors Not reviewed    Counseling intern followed up with pt. Pt appeared to be in a fairly stable mood despite expressing some feelings of frustration regarding being in the hospital. The pt says he "can't wait to go to [facility]," because he feels the groups there are beneficial for him. Pt discussed his past substance abuse history but reports that he wants to continue to be clean and live with his emotions without numbing them. The pt discussed his children, who appear to be a source of emotional strength for him although he does occasionally feel sad while discussing them because his sons are mad at him for not being there for them. The pt presented with appropriate affect and did not report any active suicidal ideations during the visit. Counselor provided emotional support to pt and utilized CBT techniques to challenge the client's all-or-nothing thinking. Counselor will follow up with pt on Monday at [facility].  Graciela Husbands Counseling Intern
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
## 2014-12-28 NOTE — Consult Note (Signed)
[facility] Face-to-Face Psychiatry Consult   Reason for Consult:  Depression, anxiety and suicide  Referring Physician:  Dr. Charlies Silvers Patient Identification: [name] MRN:  [id number] Principal Diagnosis: Substance induced mood disorder Diagnosis:   Patient Active Problem List   Diagnosis Date Noted  . Upper gastrointestinal bleeding [K92.2] 12/27/2014  . Hematemesis [K92.0] 12/27/2014  . Peripheral neuropathy [G62.9] 12/27/2014  . Tobacco abuse [Z72.0] 12/27/2014  . Alcohol abuse [F10.10] 12/27/2014  . Polysubstance abuse [F19.10] 12/27/2014  . PTSD (post-traumatic stress disorder) [F43.10] 12/24/2014  . Substance induced mood disorder [F19.94] 11/16/2014  . Agitation [R45.1]   . Homicidal ideations [R45.850]     Total Time spent with patient: 45 minutes  Subjective:   [name] is a 34 y.o. male patient admitted with hematemesis.  HPI:  [name] is a 34 y.o. male seen, chart reviewed for face-to-face psychiatry consultation evaluation for substance induced mood disorder, polysubstance abuse, posttraumatic stress disorder and recently admitted to [facility] and then transferred to [facility] for evaluation of hematemesis. Patient continued to report symptoms of depression, anxiety and substance abuse. Patient has suicidal thoughts but denies homicidal thoughts and psychosis. Patient reportedly self-medicating himself since he was 34 years old. Patient reportedly was witness death her family members at that time and was previously received mental health treatment.   Past medical history of chronic right shoulder pain, polysubstance abuse who has been hospitalized at behavioral health Hospital for depression and suicidal ideations for the past 4 days and then this morning had an episode of large hematemesis. Brought into the emergency room where he was noted to have stable vitals including blood pressure and heart rate, but hemoglobin which had been 14.1 on  9/16 had dropped to 12.6. Patient was complaining of abdominal discomfort. He has been on NSAIDs at home which she takes frequently for his right shoulder. He states he is a previous history of a gastric ulcer Hospitalists were called for further evaluation.  HPI Elements:   Location:  substance abuse. Quality:  fair to poor. Severity:  suicide ideations. Timing:  relapse. Duration:  few days. Context:  psychosocial stresses and substance abuse.  Past Medical History:  Past Medical History  Diagnosis Date  . Testicular torsion   . Alcohol abuse     Past Surgical History  Procedure Laterality Date  . Right arm surgery    . Surgery scrotal / testicular     Family History: History reviewed. No pertinent family history. Social History:  History  Alcohol Use  . Yes    Comment: last drink 12/20/2014     History  Drug Use  . Yes  . Special: Marijuana    Social History   Social History  . Marital Status: Married    Spouse Name: N/A  . Number of Children: N/A  . Years of Education: N/A   Social History Main Topics  . Smoking status: Current Every Day Smoker    Types: Cigarettes  . Smokeless tobacco: Never Used  . Alcohol Use: Yes     Comment: last drink 12/20/2014  . Drug Use: Yes    Special: Marijuana  . Sexual Activity: Yes   Other Topics Concern  . None   Social History Narrative   Additional Social History:                          Allergies:  No Known Allergies  Labs:  Results for orders placed  or performed during the hospital encounter of 12/27/14 (from the past 48 hour(s))  Occult blood card to lab, stool     Status: None   Collection Time: 12/27/14  2:46 PM  Result Value Ref Range   Fecal Occult Bld NEGATIVE NEGATIVE  Hematocrit     Status: None   Collection Time: 12/27/14  3:00 PM  Result Value Ref Range   HCT 39.5 39.0 - 52.0 %  Hemoglobin     Status: Abnormal   Collection Time: 12/27/14  3:00 PM  Result Value Ref Range   Hemoglobin  12.8 (L) 13.0 - 17.0 g/dL  Basic metabolic panel     Status: Abnormal   Collection Time: 12/28/14  5:40 AM  Result Value Ref Range   Sodium 141 135 - 145 mmol/L   Potassium 4.5 3.5 - 5.1 mmol/L   Chloride 106 101 - 111 mmol/L   CO2 28 22 - 32 mmol/L   Glucose, Bld 93 65 - 99 mg/dL   BUN 7 6 - 20 mg/dL   Creatinine, Ser 0.82 0.61 - 1.24 mg/dL   Calcium 8.6 (L) 8.9 - 10.3 mg/dL   GFR calc non Af Amer >60 >60 mL/min   GFR calc Af Amer >60 >60 mL/min    Comment: (NOTE) The eGFR has been calculated using the CKD EPI equation. This calculation has not been validated in all clinical situations. eGFR's persistently <60 mL/min signify possible Chronic Kidney Disease.    Anion gap 7 5 - 15  CBC     Status: Abnormal   Collection Time: 12/28/14  5:40 AM  Result Value Ref Range   WBC 4.3 4.0 - 10.5 K/uL   RBC 4.16 (L) 4.22 - 5.81 MIL/uL   Hemoglobin 12.4 (L) 13.0 - 17.0 g/dL   HCT 39.4 39.0 - 52.0 %   MCV 94.7 78.0 - 100.0 fL   MCH 29.8 26.0 - 34.0 pg   MCHC 31.5 30.0 - 36.0 g/dL   RDW 15.9 (H) 11.5 - 15.5 %   Platelets 134 (L) 150 - 400 K/uL    Vitals: Blood pressure 135/91, pulse 76, temperature 97.9 F (36.6 C), temperature source Oral, resp. rate 13, SpO2 99 %.  Risk to Self: Is patient at risk for suicide?: Yes Risk to Others:   Prior Inpatient Therapy:   Prior Outpatient Therapy:    Current Facility-Administered Medications  Medication Dose Route Frequency Provider Last Rate Last Dose  . 0.9 %  sodium chloride infusion   Intravenous Continuous Annita Brod, MD 100 mL/hr at 12/28/14 1055    . buPROPion (WELLBUTRIN XL) 24 hr tablet 300 mg  300 mg Oral Daily Annita Brod, MD   300 mg at 12/28/14 1034  . gabapentin (NEURONTIN) capsule 600 mg  600 mg Oral TID AC & HS Annita Brod, MD   600 mg at 12/28/14 0718  . methocarbamol (ROBAXIN) tablet 750 mg  750 mg Oral QID Annita Brod, MD   750 mg at 12/28/14 1034  . morphine 2 MG/ML injection 1 mg  1 mg Intravenous  Q3H PRN Annita Brod, MD   1 mg at 12/28/14 1055  . nicotine (NICODERM CQ - dosed in mg/24 hours) patch 21 mg  21 mg Transdermal Daily Annita Brod, MD   21 mg at 12/28/14 1039  . ondansetron (ZOFRAN) tablet 4 mg  4 mg Oral Q6H PRN Annita Brod, MD       Or  . ondansetron [facility]) injection  4 mg  4 mg Intravenous Q6H PRN Annita Brod, MD      . Derrill Memo ON 12/29/2014] pantoprazole (PROTONIX) EC tablet 40 mg  40 mg Oral Daily Robbie Lis, MD      . QUEtiapine (SEROQUEL) tablet 100 mg  100 mg Oral TID WC Annita Brod, MD   100 mg at 12/28/14 0718  . QUEtiapine (SEROQUEL) tablet 150 mg  150 mg Oral QHS Annita Brod, MD   150 mg at 12/27/14 2125  . traZODone (DESYREL) tablet 50 mg  50 mg Oral QHS Annita Brod, MD   50 mg at 12/27/14 2125    Musculoskeletal: Strength & Muscle Tone: within normal limits Gait & Station: normal Patient leans: N/A  Psychiatric Specialty Exam: Physical Exam  ROS  Blood pressure 135/91, pulse 76, temperature 97.9 F (36.6 C), temperature source Oral, resp. rate 13, SpO2 99 %.There is no weight on file to calculate BMI.  General Appearance: Guarded  Eye Contact::  Good  Speech:  Clear and Coherent  Volume:  Normal  Mood:  Anxious and Depressed  Affect:  Constricted and Depressed  Thought Process:  Coherent and Goal Directed  Orientation:  Full (Time, Place, and Person)  Thought Content:  WDL  Suicidal Thoughts:  Yes.  without intent/plan  Homicidal Thoughts:  No  Memory:  Immediate;   Fair Recent;   Fair  Judgement:  Impaired  Insight:  Fair  Psychomotor Activity:  Decreased  Concentration:  Fair  Recall:  Good  Fund of Knowledge:Good  Language: Good  Akathisia:  Negative  Handed:  Right  AIMS (if indicated):     Assets:  Communication Skills Desire for Improvement Financial Resources/Insurance Leisure Time Resilience Social Support  ADL's:  Intact  Cognition: WNL  Sleep:      Medical Decision Making: Review  of Psycho-Social Stressors (1), Review or order clinical lab tests (1), Established Problem, Worsening (2), Review of Last Therapy Session (1), Review or order medicine tests (1), Review of Medication Regimen & Side Effects (2) and Review of New Medication or Change in Dosage (2)  Treatment Plan Summary: Daily contact with patient to assess and evaluate symptoms and progress in treatment and Medication management  Plan:  Continue safety sitter Recommend psychiatric Inpatient admission when medically cleared. Supportive therapy provided about ongoing stressors.  Appreciate psychiatric consultation Please contact [phone] or [phone] if needs further assistance   Disposition: call SW for psych placement when medically cleared   JONNALAGADDA,JANARDHAHA R. 12/28/2014 12:08 PM

## 2014-12-28 NOTE — Progress Notes (Signed)
Patient ID: [name], male   DOB: [birth year], 34 y.o.   MRN: [id number] [facility] PROGRESS NOTE  Derrall Hicks UUV:[id number] DOB: May 18, 1980 DOA: 12/27/2014 PCP: No PCP Per Patient - will set up with [facility] on discharge   Brief narrative:    34 y.o. male with past medical history of polysubstance abuse, depression. He presented to [facility] where he was hospitalized for depression and suicidal ideations for past few days prior to this admission. He was brought to ED due to hematemesis. He underwent EGD 12/28/2014 with findings of esophagitis.  Anticipated discharge: to [facility] or home 9/24 depending on psychiatry evaluation.  Assessment/Plan:    Principal Problem:   Hematemesis / Acute upper GI hemorrhage - Likely due to esophagitis seen on EGD - Will continue PPI therapy per GI recommendations - Diet as tolerated   Active Problems:   Peripheral neuropathy - Continue gabapentin     Polysubstance abuse - Most recent UDS with THC and prior to that cocaine and benzos    Depression / Suicidal ideation - Will get psych for evaluation - Sitter at the bedside - Continue Wellbutrin and Seroquel    Tobacco abuse - Continue nicotine patch    DVT Prophylaxis  - SCD's bilaterally    Code Status: Full.  Family Communication:  Family not at the bedside this am Disposition Plan: depending on psych recommendations, either to [facility] versus discharge to home tomorrow 9/24  IV access:  Peripheral IV  Procedures and diagnostic studies:    EGD 12/28/2014  Medical Consultants:  Gastroenterology Psychiatry  Other Consultants:  None   IAnti-Infectives:   None    Manson Passey, MD  [facility] Pager [phone]  Time spent in minutes: 15 minutes  If 7PM-7AM, please contact night-coverage [email or web address] Password [facility] 12/28/2014, 7:12 PM      HPI/Subjective: No acute overnight events. Patient reports no vomiting.   Objective: Filed Vitals:   12/28/14 0850 12/28/14 0855  12/28/14 1300 12/28/14 1328  BP: 131/90 135/91  134/90  Pulse: 78 76  81  Temp:    98.1 F (36.7 C)  TempSrc:    Oral  Resp: Height:    (1.702 m)   SpO2: 99% 99%  96%    Intake/Output Summary (Last 24 hours) at 12/28/14 1912 Last data filed at 12/28/14 0557  Gross per 24 hour  Intake   1200 ml  Output   1900 ml  Net   -700 ml    Exam:   General:  Pt is alert,  not in acute distress  Cardiovascular: Regular rate and rhythm, S1/S2, no murmurs  Respiratory: Clear to auscultation bilaterally, no wheezing, no crackles, no rhonchi  Abdomen: Soft, non tender, non distended, bowel sounds present  Extremities: No edema, pulses DP and PT palpable bilaterally  Neuro: Grossly nonfocal  Data Reviewed: Basic Metabolic Panel:  Recent Labs Lab 12/27/14 0613 12/28/14 0540  NA 138 141  K 4.0 4.5  CL 103 106  CO2 30 28  GLUCOSE 92 93  BUN 16 7  CREATININE 0.90 0.82  CALCIUM 9.0 8.6*   Liver Function Tests:  Recent Labs Lab 12/27/14 0613  AST 39  ALT 31  ALKPHOS 45  BILITOT 0.2*  PROT 6.3*  ALBUMIN 3.8    Recent Labs Lab 12/27/14 0613  LIPASE 36   No results for input(s): AMMONIA in the last 168 hours. CBC:  Recent Labs Lab 12/27/14 0613 12/27/14 1500 12/28/14 0540  WBC 5.9  --  4.3  NEUTROABS 4.4  --   --   HGB 12.6* 12.8* 12.4*  HCT 38.9* 39.5 39.4  MCV 93.1  --  94.7  PLT 126*  --  134*   Cardiac Enzymes: No results for input(s): CKTOTAL, CKMB, CKMBINDEX, TROPONINI in the last 168 hours. BNP: Invalid input(s): POCBNP CBG: No results for input(s): GLUCAP in the last 168 hours.  No results found for this or any previous visit (from the past 240 hour(s)).   Scheduled Meds: . buPROPion  300 mg Oral Daily  . gabapentin  600 mg Oral TID AC & HS  . methocarbamol  750 mg Oral QID  . nicotine  21 mg Transdermal Daily  . [START ON 12/29/2014] pantoprazole  40 mg Oral Daily  . QUEtiapine  100 mg Oral TID WC  . QUEtiapine  150 mg  Oral QHS  . traZODone  50 mg Oral QHS   Continuous Infusions: . sodium chloride 100 mL/hr at 12/28/14 1759

## 2014-12-28 NOTE — Op Note (Signed)
[facility] [address] [place], [address]   ENDOSCOPY PROCEDURE REPORT  PATIENT: Brad, Meza  MR#: [id number] BIRTHDATE: [birth year] , 33  yrs. old GENDER: male ENDOSCOPIST:Patrick Elnoria Howard, MD REFERRED BY: PROCEDURE DATE:  01-18-2015 PROCEDURE:   EGD, diagnostic ASA CLASS:    Class III INDICATIONS: Hematemesis MEDICATION: Benadryl 50 mg IV, Fentanyl 50 mcg IV, and Versed 6 mg IV TOPICAL ANESTHETIC:   Cetacaine Spray  DESCRIPTION OF PROCEDURE:   After the risks and benefits of the procedure were explained, informed consent was obtained.  The [employer] G[employer]OR[employer]COP[employer] [id number]  endoscope was introduced through the mouth  and advanced to the second portion of the duodenum .  The instrument was slowly withdrawn as the mucosa was fully examined. Estimated blood loss is zero unless otherwise noted in this procedure report.   FINDINGS: Upon intial entry into the esophagus a significant amount of liquid refluxate was encountered.  This was quickly suctioned. In the distal esophagus there was evidence of an LA Grade D esophagitis in the setting of a 2 cm hiatal hernia.  The gastric lumen and duodenal lumen were normal.    Retroflexed views revealed no abnormalities.    The scope was then withdrawn from the patient and the procedure completed.  COMPLICATIONS: There were no immediate complications.  ENDOSCOPIC IMPRESSION: 1) LA Grade D esophagitis. 2) Hiatal hernia.  RECOMMENDATIONS: 1) PPI QD 30 minutes before breakfast. _______________________________ eSigned:  Jeani Hawking, MD 01-18-2015 8:30 AM   cc:  CPT CODES: ICD CODES:  The ICD and CPT codes recommended by this software are interpretations from the data that the clinical staff has captured with the software.  The verification of the translation of this report to the ICD and CPT codes and modifiers is the sole responsibility of the health care institution and practicing physician where this report was  generated.  [employer] will not be held responsible for the validity of the ICD and CPT codes included on this report.  AMA assumes no liability for data contained or not contained herein. CPT is a Publishing rights manager of the [employer].

## 2014-12-28 NOTE — Clinical Social Work Psych Assess (Signed)
Clinical Social Work Programme researcher, broadcasting/film/video Social Worker:  Duke Salvia, LCSW Date/Time:  12/28/2014, 4:10 PM Referred By:  Physician Date Referred:  12/28/14 Reason for Referral:  Behavioral Health Issues   Presenting Symptoms/Problems  Presenting Symptoms/Problems(in person's/family's own words):  Pt admitted to [facility] for suicidial ideation. [facility] to [facility] after having shortness of breath. Pt has Bihm history of psychiatric illness and substance abuse. Psychiatrist familiar with patient. Paitent to return to [facility] once medically stable.    Abuse/Neglect/Trauma History  Abuse/Neglect/Trauma History:  Denies History Abuse/Neglect/Trauma History Comments (indicate dates):  Psychiatric History  Psychiatric History:  Inpatient/Hospitalization, Outpatient Treatment Psychiatric Medication:     Current Mental Health Hospitalizations/Previous Mental Health History:     Current Provider:   Place and Date:    Current Medications:     Previous Inpatient Admission/Date/Reason:     Emotional Health/Current Symptoms  Suicide/Self Harm: Suicide Attempt in the Past (date/description), Suicidal Ideation (ex. "I can't take anymore, I wish I could disappear") Suicide Attempt in Past (date/description):    Other Harmful Behavior (ex. homicidal ideation) (describe):     Psychotic/Dissociative Symptoms  Psychotic/Dissociative Symptoms: None Reported Other Psychotic/Dissociative Symptoms:     Attention/Behavioral Symptoms  Attention/Behavioral Symptoms: Impulsive Other Attention/Behavioral Symptoms:     Cognitive Impairment  Cognitive Impairment:  Within Normal Limits Other Cognitive Impairment:     Mood and Adjustment  Mood and Adjustment:  Flat, Labile   Stress, Anxiety, Trauma, Any Recent Loss/Stressor  Stress, Anxiety, Trauma, Any Recent Loss/Stressor:   Anxiety (frequency):  Phobia (specify):    Compulsive Behavior  (specify):    Obsessive Behavior (specify):    Other Stress, Anxiety, Trauma, Any Recent Loss/Stressor:     Substance Abuse/Use  Substance Abuse/Use: Current Substance Use SBIRT Completed (please refer for detailed history): No Self-reported Substance Use (last use and frequency):   Urinary Drug Screen Completed: No Alcohol Level:     Environment/Housing/Living Arrangement  Environmental/Housing/Living Arrangement: Stable Housing Who is in the Home:   Emergency Contact:     Financial  Financial: Medicaid   Patient's Strengths and Goals  Patient's Strengths and Goals (patient's own words):     Clinical Social Worker's Interpretive Summary  Clinical Social Workers Interpretive Summary:     Disposition  Disposition: Inpatient Referral Made ([facility], [facility], [place])

## 2014-12-29 ENCOUNTER — Encounter (HOSPITAL_COMMUNITY): Payer: Self-pay

## 2014-12-29 ENCOUNTER — Inpatient Hospital Stay (HOSPITAL_COMMUNITY)
Admission: AD | Admit: 2014-12-29 | Discharge: 2015-01-04 | DRG: 897 | Disposition: A | Discharge status: Other Acute Inpatient Hospital | Payer: Federal, State, Local not specified - Other | Source: Intra-hospital | Attending: Psychiatry | Admitting: Psychiatry

## 2014-12-29 DIAGNOSIS — F329 Major depressive disorder, single episode, unspecified: Secondary | ICD-10-CM | POA: Diagnosis present

## 2014-12-29 DIAGNOSIS — K210 Gastro-esophageal reflux disease with esophagitis: Secondary | ICD-10-CM | POA: Diagnosis present

## 2014-12-29 DIAGNOSIS — Z915 Personal history of self-harm: Secondary | ICD-10-CM | POA: Diagnosis not present

## 2014-12-29 DIAGNOSIS — F12188 Cannabis abuse with other cannabis-induced disorder: Secondary | ICD-10-CM | POA: Diagnosis present

## 2014-12-29 DIAGNOSIS — F1014 Alcohol abuse with alcohol-induced mood disorder: Secondary | ICD-10-CM | POA: Diagnosis present

## 2014-12-29 DIAGNOSIS — F17210 Nicotine dependence, cigarettes, uncomplicated: Secondary | ICD-10-CM | POA: Diagnosis present

## 2014-12-29 DIAGNOSIS — F1910 Other psychoactive substance abuse, uncomplicated: Secondary | ICD-10-CM | POA: Diagnosis present

## 2014-12-29 DIAGNOSIS — F1994 Other psychoactive substance use, unspecified with psychoactive substance-induced mood disorder: Secondary | ICD-10-CM | POA: Diagnosis not present

## 2014-12-29 DIAGNOSIS — R45851 Suicidal ideations: Secondary | ICD-10-CM | POA: Diagnosis present

## 2014-12-29 DIAGNOSIS — F32A Depression, unspecified: Secondary | ICD-10-CM | POA: Insufficient documentation

## 2014-12-29 MED ORDER — MAGNESIUM HYDROXIDE 400 MG/5ML PO SUSP: 30.0000 mL | Freq: Every day | ORAL | Status: DC | PRN

## 2014-12-29 MED ORDER — TRAZODONE HCL 50 MG PO TABS
50.0000 mg | ORAL_TABLET | Freq: Every evening | ORAL | Status: DC | PRN
  Administered 2014-12-29: 50 mg via ORAL
  Filled 2014-12-29: qty 1

## 2014-12-29 MED ORDER — QUETIAPINE FUMARATE 50 MG PO TABS
50.0000 mg | ORAL_TABLET | Freq: Every day | ORAL | Status: DC
  Administered 2014-12-29: 50 mg via ORAL
  Filled 2014-12-29 (×3): qty 1

## 2014-12-29 MED ORDER — QUETIAPINE FUMARATE 50 MG PO TABS
50.0000 mg | ORAL_TABLET | Freq: Once | ORAL | Status: AC
  Administered 2014-12-29: 50 mg via ORAL
  Filled 2014-12-29 (×2): qty 1

## 2014-12-29 MED ORDER — GABAPENTIN 300 MG PO CAPS
300.0000 mg | ORAL_CAPSULE | Freq: Every day | ORAL | Status: DC
  Administered 2014-12-29: 300 mg via ORAL
  Filled 2014-12-29 (×3): qty 1

## 2014-12-29 MED ORDER — ACETAMINOPHEN 325 MG PO TABS
650.0000 mg | ORAL_TABLET | Freq: Four times a day (QID) | ORAL | Status: DC | PRN
  Administered 2014-12-31 – 2015-01-03 (×4): 650 mg via ORAL
  Filled 2014-12-29 (×4): qty 2

## 2014-12-29 MED ORDER — OXYCODONE-ACETAMINOPHEN 5-325 MG PO TABS
2.0000 | ORAL_TABLET | Freq: Once | ORAL | Status: AC
  Administered 2014-12-29: 2 via ORAL
  Filled 2014-12-29: qty 2

## 2014-12-29 MED ORDER — PNEUMOCOCCAL VAC POLYVALENT 25 MCG/0.5ML IJ INJ
0.5000 mL | INJECTION | INTRAMUSCULAR | Status: AC
  Administered 2014-12-30: 0.5 mL via INTRAMUSCULAR

## 2014-12-29 MED ORDER — GI COCKTAIL ~~LOC~~
30.0000 mL | Freq: Once | ORAL | Status: AC
  Administered 2014-12-29: 30 mL via ORAL
  Filled 2014-12-29: qty 30

## 2014-12-29 MED ORDER — QUETIAPINE FUMARATE 50 MG PO TABS: 150.0000 mg | ORAL_TABLET | Freq: Every day | ORAL | Status: DC

## 2014-12-29 MED ORDER — INFLUENZA VAC SPLIT QUAD 0.5 ML IM SUSY
0.5000 mL | PREFILLED_SYRINGE | INTRAMUSCULAR | Status: AC
  Administered 2014-12-30: 0.5 mL via INTRAMUSCULAR
  Filled 2014-12-29: qty 0.5

## 2014-12-29 MED ORDER — ALUM & MAG HYDROXIDE-SIMETH 200-200-20 MG/5ML PO SUSP
30.0000 mL | ORAL | Status: DC | PRN
  Administered 2014-12-29 – 2015-01-01 (×3): 30 mL via ORAL
  Filled 2014-12-29 (×3): qty 30

## 2014-12-29 MED ORDER — BUPROPION HCL ER (XL) 300 MG PO TB24
300.0000 mg | ORAL_TABLET | Freq: Every day | ORAL | Status: DC
Start: 2014-12-29 — End: 2015-01-04

## 2014-12-29 MED ORDER — PANTOPRAZOLE SODIUM 40 MG PO TBEC: 40.0000 mg | DELAYED_RELEASE_TABLET | Freq: Every day | ORAL | Status: DC

## 2014-12-29 MED ORDER — QUETIAPINE FUMARATE 100 MG PO TABS: 100.0000 mg | ORAL_TABLET | Freq: Three times a day (TID) | ORAL | Status: DC

## 2014-12-29 MED ORDER — GABAPENTIN 300 MG PO CAPS
600.0000 mg | ORAL_CAPSULE | Freq: Three times a day (TID) | ORAL | Status: DC
Start: 2014-12-29 — End: 2015-01-04

## 2014-12-29 MED ORDER — TRAZODONE HCL 50 MG PO TABS: 50.0000 mg | ORAL_TABLET | Freq: Every day | ORAL | Status: DC

## 2014-12-29 MED ORDER — NICOTINE POLACRILEX 2 MG MT GUM
2.0000 mg | CHEWING_GUM | OROMUCOSAL | Status: DC | PRN
  Administered 2014-12-29 – 2015-01-04 (×23): 2 mg via ORAL
  Filled 2014-12-29 (×7): qty 1
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
## 2014-12-29 NOTE — Clinical Social Work Note (Signed)
CSW provided MD with bed at [facility].  Psychiatrist updated his note.  CSW met with pt to have voluntary paperwork for [facility] signed and faxed to [facility].  CSW called for [facility] transport to room 305 bed 2  .Dede Query, LCSW [facility] Clinical Social Worker - Weekend Coverage cell #: [phone]

## 2014-12-29 NOTE — Progress Notes (Signed)
Pt left at this time headed to [facility] with [facility]. Writer attempted to call [facility] for report with no answer. Will attempt again in few moments.

## 2014-12-29 NOTE — Progress Notes (Signed)
Patient ID: [name], male   DOB: 22-Jan-1981, 34 y.o.   MRN: [id number] Initial Interdisciplinary Treatment Plan   PATIENT STRESSORS: Financial difficulties Health problems Substance abuse   PATIENT STRENGTHS: Ability for insight Active sense of humor Average or above average intelligence Capable of independent living   PROBLEM LIST: Problem List/Patient Goals Date to be addressed Date deferred Reason deferred Estimated date of resolution  ""I want to turn my life around." - depression 12/29/2014      "I want to go to rehab" - alcohol/marijuana abuse 12/29/2014     "[facility] is going to get me a tent" - homelessness 12/29/2014                                          DISCHARGE CRITERIA:  Ability to meet basic life and health needs Adequate post-discharge living arrangements Improved stabilization in mood, thinking, and/or behavior Reduction of life-threatening or endangering symptoms to within safe limits  PRELIMINARY DISCHARGE PLAN: Attend 12-step recovery group Placement in alternative living arrangements  PATIENT/FAMIILY INVOLVEMENT: This treatment plan has been presented to and reviewed with the patient, [name] . The patient and family have been given the opportunity to ask questions and make suggestions.  Aurora Mask 12/29/2014, 7:54 PM

## 2014-12-29 NOTE — Clinical Social Work Note (Signed)
CSW called and spoke with [facility] to follow up on pt bed availability.  Pt was at [facility] before needing the medical hospitalization and per the RN was asking to go back to [facility] today when he knew he was medically stable.  [facility] will follow up with pt chart to see if pt was ready for discharge from in patient at [facility] or was near end of treatment and will let CSW know.  This pt will still need to be seen by psychiatry before discharging anywhere as the last note was from 12/27/14.   Marland KitchenElray Buba, LCSW [facility] Clinical Social Worker - Weekend Coverage cell #: [phone]

## 2014-12-29 NOTE — Discharge Instructions (Signed)
Hematemesis °This condition is the vomiting of blood. °CAUSES  °This can happen if you have a peptic ulcer or an irritation of the throat, stomach, or small bowel. Vomiting over and over again or swallowing blood from a nosebleed, coughing or facial injury can also result in bloody vomit. Anti-inflammatory pain medicines are a common cause of this potentially dangerous condition. The most serious causes of vomiting blood include: °· Ulcers (a bacteria called H. pylori is common cause of ulcers). °· Clotting problems. °· Alcoholism. °· Cirrhosis. °TREATMENT  °Treatment depends on the cause and the severity of the bleeding. Small amounts of blood streaks in the vomit is not the same as vomiting large amounts of bloody or dark, coffee grounds-like material. Weakness, fainting, dehydration, anemia, and continued alcohol or drug use increase the risk. Examination may include blood, vomit, or stool tests. The presence of bloody or dark stool that tests positive for blood (Hemoccult) means the bleeding has been going on for some time. Endoscopy and imaging studies may be done. Emergency treatment may include: °· IV medicines or fluids. °· Blood transfusions. °· Surgery. °Hospital care is required for high risk patients or when IV fluids or blood is needed. Upper GI bleeding can cause shock and death if not controlled. °HOME CARE INSTRUCTIONS  °· Your treatment does not require hospital care at this time. °· Remain at rest until your condition improves. °· Drink clear liquids as tolerated. °· Avoid: °¨ Alcohol. °¨ Nicotine. °¨ Aspirin. °¨ Any other anti-inflammatory medicine (ibuprofen, naproxen, and many others). °· Medications to suppress stomach acid or vomiting may be needed. Take all your medicine as prescribed. °· Be sure to see your caregiver for follow-up as recommended. °SEEK IMMEDIATE MEDICAL CARE IF:  °· You have repeated vomiting, dehydration, fainting, or extreme weakness. °· You are vomiting large amounts of  bloody or dark material. °· You pass large, dark or bloody stools. °Document Released: 04/30/2004 Document Revised: 06/15/2011 Document Reviewed: 05/16/2008 °ExitCare® Patient Information ©2015 [employer]. This information is not intended to replace advice given to you by your health care provider. Make sure you discuss any questions you have with your health care provider. ° °

## 2014-12-29 NOTE — Clinical Social Work Note (Signed)
CSW reviewed discharge summary written by MD  CSW review pt chart that reflected pt came from [facility] on 12/27/14  CSW called and spoke with MD who stated that she was required to wait for psych consult before she would know disposition of pt and discharge plans.  CSW called [facility] and let them know that pt had discharge summary and the information above.  [facility] will review pt chart and let CSW know whether they have a bed at [facility].   CSW will facilitate discharge plans once psych consult is completed and in the pt's chart.  Elray Buba, LCSW [facility] Clinical Social Worker - Weekend Coverage cell #: [phone]

## 2014-12-29 NOTE — Discharge Summary (Signed)
Physician Discharge Summary  [name] ZOX:[id number] DOB: 06-26-1980 DOA: 12/27/2014  PCP: No PCP Per Patient - will follow up in [facility] on discharge   Admit date: 12/27/2014 Discharge date: 12/29/2014  Recommendations for Outpatient Follow-up:  1. New medication protonix once a day   Discharge Diagnoses:  Principal Problem:   Hematemesis Active Problems:   Acute upper GI hemorrhage   Esophagitis   Peripheral neuropathy   Polysubstance abuse   Depression   Suicidal ideation   Tobacco abuse    Discharge Condition: stable   Diet recommendation: as tolerated   History of present illness:  34 y.o. male with past medical history of polysubstance abuse, depression. He presented to [facility] where he was hospitalized for depression and suicidal ideations for past few days prior to this admission. He was brought to ED due to hematemesis. He underwent EGD 12/28/2014 with findings of esophagitis.  Hospital Course:   Assessment/Plan:    Principal Problem:  Hematemesis / Acute upper GI hemorrhage - Secondary to esophagitis  - EGD done 9/23 demonstrated esophagitis - GI recommended protonix once a day which was prescribed on discharge  - Tolerated regular diet well  Active Problems:  Peripheral neuropathy - Continue gabapentin on discahrge    Polysubstance abuse - Most recent UDS (+) for THC  - Prior UDS positive for cocaine and benzos   Depression / Suicidal ideation - Sitter at the bedside - Continue Wellbutrin and Seroquel - Psych to determine if [facility] hospitalization needed    Tobacco abuse - Nicotine patch ordered    DVT Prophylaxis  - SCD's in hospital    Code Status: Full.  Family Communication: Family not at the bedside  IV access:  Peripheral IV  Procedures and diagnostic studies:   EGD 12/28/2014  Medical Consultants:  Gastroenterology Psychiatry  Other Consultants:  None   IAnti-Infectives:   None    Signed:  Manson Passey, MD  [facility] 12/29/2014, 10:22 AM  Pager #: [phone]  Time spent in minutes: less than 30 minutes   Discharge Exam: Filed Vitals:   12/29/14 0539  BP: 141/92  Pulse: 92  Temp: 97.5 F (36.4 C)  Resp: 18   Filed Vitals:   12/28/14 1300 12/28/14 1328 12/28/14 2027 12/29/14 0539  BP:  134/90 147/97 141/92  Pulse:  81 91 92  Temp:  98.1 F (36.7 C) 98.1 F (36.7 C) 97.5 F (36.4 C)  TempSrc:  Oral Oral Oral  Resp:  16 16 18   Height: 5\' 7"  (1.702 m)     SpO2:  96% 98% 97%    General: Pt is alert, follows commands appropriately, not in acute distress Cardiovascular: Regular rate and rhythm, S1/S2 +, no murmurs Respiratory: Clear to auscultation bilaterally, no wheezing, no crackles, no rhonchi Abdominal: Soft, non tender, non distended, bowel sounds +, no guarding Extremities: no edema, no cyanosis, pulses palpable bilaterally DP and PT Neuro: Grossly nonfocal  Discharge Instructions  Discharge Instructions    Call MD for:  difficulty breathing, headache or visual disturbances    Complete by:  As directed      Call MD for:  persistant nausea and vomiting    Complete by:  As directed      Call MD for:  severe uncontrolled pain    Complete by:  As directed      Diet - low sodium heart healthy    Complete by:  As directed      Increase activity slowly    Complete  by:  As directed             Medication List    TAKE these medications        buPROPion 300 MG 24 hr tablet  Commonly known as:  WELLBUTRIN XL  Take 1 tablet (300 mg total) by mouth daily.     gabapentin 300 MG capsule  Commonly known as:  NEURONTIN  Take 2 capsules (600 mg total) by mouth 4 (four) times daily -  before meals and at bedtime.     pantoprazole 40 MG tablet  Commonly known as:  PROTONIX  Take 1 tablet (40 mg total) by mouth daily.     QUEtiapine 100 MG tablet  Commonly known as:  SEROQUEL  Take 1 tablet (100 mg total) by mouth 3 (three) times daily with meals.      QUEtiapine 50 MG tablet  Commonly known as:  SEROQUEL  Take 3 tablets (150 mg total) by mouth at bedtime.     traZODone 50 MG tablet  Commonly known as:  DESYREL  Take 1 tablet (50 mg total) by mouth at bedtime.           Follow-up Information    Follow up with [facility]    . Schedule an appointment as soon as possible for a visit in 2 weeks.   Why:  Follow up appt after recent hospitalization   Contact information:   [address] [facility] [address] [phone]       The results of significant diagnostics from this hospitalization (including imaging, microbiology, ancillary and laboratory) are listed below for reference.    Significant Diagnostic Studies: Dg Thoracic Spine 2 View  12/21/2014   CLINICAL DATA:  Dorsalgia following fall  EXAM: THORACIC SPINE 3 VIEWS  COMPARISON:  None.  FINDINGS: Frontal, lateral, and swimmer's views obtained. There is no fracture or spondylolisthesis. Disc spaces appear intact. No erosive change.  IMPRESSION: No fracture or spondylolisthesis.  No appreciable arthropathy.   Electronically Signed   By: Bretta Bang III M.D.   On: 12/21/2014 10:01   Dg Shoulder Right  12/21/2014   CLINICAL DATA:  Fall, proximal humerus pain  EXAM: RIGHT SHOULDER - 2+ VIEW  COMPARISON:  12/04/2014  FINDINGS: There is no evidence of fracture or dislocation. There is no evidence of arthropathy or other focal bone abnormality. Soft tissues are unremarkable. Incidental sclerotic bone island in the right humerus head.  IMPRESSION: No acute osseous finding.   Electronically Signed   By: Judie Petit.  Shick M.D.   On: 12/21/2014 08:52   Dg Shoulder Right  12/04/2014   CLINICAL DATA:  Possible fall, with right shoulder dislocation and reduction per patient. Right anterior shoulder pain. Initial encounter.  EXAM: RIGHT SHOULDER - 2+ VIEW  COMPARISON:  None.  FINDINGS: There is no evidence of fracture or dislocation. The  right humeral head is seated within the glenoid fossa. No Hill-Sachs or osseous Bankart lesion is seen. The acromioclavicular joint is unremarkable in appearance. No significant soft tissue abnormalities are seen. The visualized portions of the right lung are clear.  IMPRESSION: No evidence of fracture or dislocation.   Electronically Signed   By: Roanna Raider M.D.   On: 12/04/2014 02:06   Ct Head Wo Contrast  12/21/2014   CLINICAL DATA:  Pain after jumping from second story balcony two days prior  EXAM: CT HEAD WITHOUT CONTRAST  CT CERVICAL SPINE WITHOUT CONTRAST  TECHNIQUE: Multidetector CT imaging  of the head and cervical spine was performed following the standard protocol without intravenous contrast. Multiplanar CT image reconstructions of the cervical spine were also generated.  COMPARISON:  Head CT December 12, 2005  FINDINGS: CT HEAD FINDINGS  The ventricles are normal in size and configuration. There is no intracranial mass hemorrhage, extra-axial fluid collection, or midline shift. Gray-white compartments are normal. No acute infarct evident. Bony calvarium appears intact. The mastoid air cells are clear. There are old fractures of the left nasal bone and medial left orbital wall.  CT CERVICAL SPINE FINDINGS  There is no fracture or spondylolisthesis. Prevertebral soft tissues and predental space regions are normal. There is no appreciable disc space narrowing. There is a calcified right paracentral disc protrusion at C3-4, not causing nerve root edema or effacement. There is mild exit foraminal narrowing at C3-4 and C4-5 due to bony hypertrophy. No disc extrusion or stenosis.  IMPRESSION: CT head: Old fractures of the left nasal bone and medial left orbital wall. No acute fracture evident. No intracranial mass, hemorrhage, or extra-axial fluid collection. Gray-white compartments appear normal.  CT cervical spine: Mild osteoarthritic change. No fracture or spondylolisthesis.   Electronically Signed    By: Bretta Bang III M.D.   On: 12/21/2014 09:12   Ct Cervical Spine Wo Contrast  12/21/2014   CLINICAL DATA:  Pain after jumping from second story balcony two days prior  EXAM: CT HEAD WITHOUT CONTRAST  CT CERVICAL SPINE WITHOUT CONTRAST  TECHNIQUE: Multidetector CT imaging of the head and cervical spine was performed following the standard protocol without intravenous contrast. Multiplanar CT image reconstructions of the cervical spine were also generated.  COMPARISON:  Head CT December 12, 2005  FINDINGS: CT HEAD FINDINGS  The ventricles are normal in size and configuration. There is no intracranial mass hemorrhage, extra-axial fluid collection, or midline shift. Gray-white compartments are normal. No acute infarct evident. Bony calvarium appears intact. The mastoid air cells are clear. There are old fractures of the left nasal bone and medial left orbital wall.  CT CERVICAL SPINE FINDINGS  There is no fracture or spondylolisthesis. Prevertebral soft tissues and predental space regions are normal. There is no appreciable disc space narrowing. There is a calcified right paracentral disc protrusion at C3-4, not causing nerve root edema or effacement. There is mild exit foraminal narrowing at C3-4 and C4-5 due to bony hypertrophy. No disc extrusion or stenosis.  IMPRESSION: CT head: Old fractures of the left nasal bone and medial left orbital wall. No acute fracture evident. No intracranial mass, hemorrhage, or extra-axial fluid collection. Gray-white compartments appear normal.  CT cervical spine: Mild osteoarthritic change. No fracture or spondylolisthesis.   Electronically Signed   By: Bretta Bang III M.D.   On: 12/21/2014 09:12   Dg Humerus Right  12/04/2014   CLINICAL DATA:  Possible fall. Anterior right shoulder pain. Status post reduction of right shoulder, per patient. Initial encounter.  EXAM: RIGHT HUMERUS - 2+ VIEW  COMPARISON:  None.  FINDINGS: There is no evidence of fracture or  dislocation. The right humerus appears grossly intact. The right humeral head remains seated at the glenoid fossa. The right acromioclavicular joint is unremarkable in appearance.  The visualized portions of the right lung are clear. No definite soft tissue abnormalities are characterized on radiograph.  IMPRESSION: No evidence of fracture or dislocation.   Electronically Signed   By: Roanna Raider M.D.   On: 12/04/2014 02:05    Microbiology: No results found for this  or any previous visit (from the past 240 hour(s)).   Labs: Basic Metabolic Panel:  Recent Labs Lab 12/27/14 0613 12/28/14 0540  NA 138 141  K 4.0 4.5  CL 103 106  CO2 30 28  GLUCOSE 92 93  BUN 16 7  CREATININE 0.90 0.82  CALCIUM 9.0 8.6*   Liver Function Tests:  Recent Labs Lab 12/27/14 0613  AST 39  ALT 31  ALKPHOS 45  BILITOT 0.2*  PROT 6.3*  ALBUMIN 3.8    Recent Labs Lab 12/27/14 0613  LIPASE 36   No results for input(s): AMMONIA in the last 168 hours. CBC:  Recent Labs Lab 12/27/14 0613 12/27/14 1500 12/28/14 0540  WBC 5.9  --  4.3  NEUTROABS 4.4  --   --   HGB 12.6* 12.8* 12.4*  HCT 38.9* 39.5 39.4  MCV 93.1  --  94.7  PLT 126*  --  134*   Cardiac Enzymes: No results for input(s): CKTOTAL, CKMB, CKMBINDEX, TROPONINI in the last 168 hours. BNP: BNP (last 3 results) No results for input(s): BNP in the last 8760 hours.  ProBNP (last 3 results) No results for input(s): PROBNP in the last 8760 hours.  CBG: No results for input(s): GLUCAP in the last 168 hours.

## 2014-12-30 DIAGNOSIS — R45851 Suicidal ideations: Secondary | ICD-10-CM

## 2014-12-30 DIAGNOSIS — F1994 Other psychoactive substance use, unspecified with psychoactive substance-induced mood disorder: Secondary | ICD-10-CM | POA: Diagnosis present

## 2014-12-30 MED ORDER — QUETIAPINE FUMARATE 100 MG PO TABS
100.0000 mg | ORAL_TABLET | Freq: Three times a day (TID) | ORAL | Status: DC
  Administered 2014-12-30 – 2014-12-31 (×3): 100 mg via ORAL
  Filled 2014-12-30 (×7): qty 1

## 2014-12-30 MED ORDER — METHOCARBAMOL 750 MG PO TABS
750.0000 mg | ORAL_TABLET | Freq: Once | ORAL | Status: AC
  Administered 2014-12-30: 750 mg via ORAL
  Filled 2014-12-30 (×2): qty 1

## 2014-12-30 MED ORDER — BUPROPION HCL ER (XL) 300 MG PO TB24
300.0000 mg | ORAL_TABLET | Freq: Every day | ORAL | Status: DC
  Administered 2014-12-30 – 2015-01-04 (×6): 300 mg via ORAL
  Filled 2014-12-30 (×8): qty 1
  Filled 2014-12-30: qty 14
  Filled 2014-12-30: qty 1

## 2014-12-30 MED ORDER — TRAZODONE HCL 150 MG PO TABS
150.0000 mg | ORAL_TABLET | Freq: Every evening | ORAL | Status: DC | PRN
  Administered 2014-12-30: 150 mg via ORAL
  Filled 2014-12-30: qty 1

## 2014-12-30 MED ORDER — QUETIAPINE FUMARATE 200 MG PO TABS
200.0000 mg | ORAL_TABLET | Freq: Every day | ORAL | Status: DC
  Administered 2014-12-30 – 2015-01-03 (×5): 200 mg via ORAL
  Filled 2014-12-30 (×8): qty 1

## 2014-12-30 MED ORDER — GABAPENTIN 300 MG PO CAPS
300.0000 mg | ORAL_CAPSULE | Freq: Three times a day (TID) | ORAL | Status: DC
Start: 2014-12-30 — End: 2014-12-31
  Administered 2014-12-30 – 2014-12-31 (×3): 300 mg via ORAL
  Filled 2014-12-30 (×7): qty 1

## 2014-12-30 MED ORDER — QUETIAPINE FUMARATE 50 MG PO TABS
50.0000 mg | ORAL_TABLET | Freq: Three times a day (TID) | ORAL | Status: DC
Start: 2014-12-30 — End: 2014-12-31
  Administered 2014-12-30 – 2014-12-31 (×3): 50 mg via ORAL
  Filled 2014-12-30 (×7): qty 1

## 2014-12-30 MED ORDER — METHOCARBAMOL 500 MG PO TABS
500.0000 mg | ORAL_TABLET | Freq: Three times a day (TID) | ORAL | Status: DC | PRN
  Administered 2014-12-30 – 2014-12-31 (×2): 500 mg via ORAL
  Filled 2014-12-30 (×2): qty 1

## 2014-12-30 MED ORDER — PANTOPRAZOLE SODIUM 40 MG PO TBEC
40.0000 mg | DELAYED_RELEASE_TABLET | Freq: Two times a day (BID) | ORAL | Status: DC
  Administered 2014-12-30 – 2015-01-04 (×10): 40 mg via ORAL
  Filled 2014-12-30 (×12): qty 1

## 2014-12-30 NOTE — Progress Notes (Signed)
Patient did attend the evening speaker AA meeting.  

## 2014-12-30 NOTE — [facility] Group Notes (Signed)
[facility] LCSW Group Therapy  12/30/2014 10:00 AM   Type of Therapy:  Group Therapy  Participation Level:  Did Not Attend  Mylo Driskill Horton, LCSW 12/30/2014 10:57 AM  

## 2014-12-30 NOTE — H&P (Addendum)
Psychiatric Admission Assessment Adult  Patient Identification: [name] MRN:  [id number] Date of Evaluation:  12/30/2014 Chief Complaint:  MDD ETOH USE DISORDER Principal Diagnosis: Substance induced mood disorder Diagnosis:   Patient Active Problem List   Diagnosis Date Noted  . Depressed [F32.9] 12/29/2014  . Acute upper GI hemorrhage [K92.2] 12/28/2014  . Depression [F32.9] 12/28/2014  . Suicidal ideation [R45.851] 12/28/2014  . Esophagitis [K20.9] 12/28/2014  . Tobacco abuse [Z72.0] 12/28/2014  . Hematemesis [K92.0] 12/27/2014  . Peripheral neuropathy [G62.9] 12/27/2014  . Polysubstance abuse [F19.10] 12/27/2014   History of Present Illness: [name] is a 34 y.o. male admitted from [facility] after medically cleared for hematemesis and started protonix by medical team. Patient has no more bleeding but understand that he has esophagitis and hernia as EGD completed on 12/28/14. Patient continue to report depression, status post suicide attempt of jumping from two storied balcony and has shoulder pain. He has denied current suicide ideation but continue to report depression, anxiety and denied craving for drugs at this time. He is willing to take medication and wishes to find a place like caring services when discharged from hospital. He was initially presented to [facility] where he was hospitalized for depression and suicidal ideations for past few days prior to [facility] admission.    Elements:  Location:  depression. Quality:  poor. Severity:  status post suicide attempt by jumping from two storied balcony. Timing:  overwhelmed with stresses. Duration:  few days. Context:  psychosocial stresses and substance abuse. Associated Signs/Symptoms: Depression Symptoms:  depressed mood, anhedonia, psychomotor retardation, fatigue, hopelessness, suicidal attempt, anxiety, weight loss, decreased labido, decreased appetite, (Hypo) Manic Symptoms:  Distractibility, Elevated  Mood, Impulsivity, Irritable Mood, Anxiety Symptoms:  Excessive Worry, Psychotic Symptoms:  denied PTSD Symptoms: NA Total Time spent with patient: 45 minutes  Past Medical History:  Past Medical History  Diagnosis Date  . Testicular torsion   . Alcohol abuse     Past Surgical History  Procedure Laterality Date  . Right arm surgery    . Surgery scrotal / testicular     Family History: History reviewed. No pertinent family history. Social History:  History  Alcohol Use  . Yes    Comment: last drink 12/20/2014     History  Drug Use  . Yes  . Special: Marijuana    Social History   Social History  . Marital Status: Married    Spouse Name: N/A  . Number of Children: N/A  . Years of Education: N/A   Social History Main Topics  . Smoking status: Current Every Day Smoker    Types: Cigarettes  . Smokeless tobacco: Never Used  . Alcohol Use: Yes     Comment: last drink 12/20/2014  . Drug Use: Yes    Special: Marijuana  . Sexual Activity: Yes   Other Topics Concern  . None   Social History Narrative   Additional Social History:                          Musculoskeletal: Strength & Muscle Tone: within normal limits Gait & Station: normal Patient leans: N/A  Psychiatric Specialty Exam: Physical Exam  ROS  Blood pressure 115/69, pulse 112, temperature 97.9 F (36.6 C), temperature source Oral, resp. rate 17, height 5' 7"  (1.702 m), weight 71.668 kg (158 lb), SpO2 97 %.Body mass index is 24.74 kg/(m^2).  General Appearance: Guarded  Eye Contact::  Good  Speech:  Clear and Coherent  Volume:  Decreased  Mood:  Anxious and Depressed  Affect:  Depressed and Flat  Thought Process:  Coherent and Goal Directed  Orientation:  Full (Time, Place, and Person)  Thought Content:  Rumination  Suicidal Thoughts:  Yes.  without intent/plan  Homicidal Thoughts:  No  Memory:  Immediate;   Fair Recent;   Fair  Judgement:  Impaired  Insight:  Fair   Psychomotor Activity:  Decreased  Concentration:  Good  Recall:  Good  Fund of Knowledge:Good  Language: Good  Akathisia:  Negative  Handed:  Right  AIMS (if indicated):     Assets:  Communication Skills Desire for Improvement  ADL's:  Intact  Cognition: WNL  Sleep:  Number of Hours: 5.5   Risk to Self: Is patient at risk for suicide?: Yes Risk to Others:   Prior Inpatient Therapy:   Prior Outpatient Therapy:    Alcohol Screening: 1. How often do you have a drink containing alcohol?: 4 or more times a week 2. How many drinks containing alcohol do you have on a typical day when you are drinking?: 10 or more 3. How often do you have six or more drinks on one occasion?: Daily or almost daily Preliminary Score: 8 5. How often during the last year have you failed to do what was normally expected from you becasue of drinking?: Weekly 6. How often during the last year have you needed a first drink in the morning to get yourself going after a heavy drinking session?: Daily or almost daily 7. How often during the last year have you had a feeling of guilt of remorse after drinking?: Monthly 8. How often during the last year have you been unable to remember what happened the night before because you had been drinking?: Weekly 9. Have you or someone else been injured as a result of your drinking?: No 10. Has a relative or friend or a doctor or another health worker been concerned about your drinking or suggested you cut down?: Yes, during the last year Alcohol Use Disorder Identification Test Final Score (AUDIT): 28 Brief Intervention: Yes  Allergies:  No Known Allergies Lab Results: No results found for this or any previous visit (from the past 48 hour(s)). Current Medications: Current Facility-Administered Medications  Medication Dose Route Frequency Provider Last Rate Last Dose  . acetaminophen (TYLENOL) tablet 650 mg  650 mg Oral Q6H PRN Ambrose Finland, MD      . alum & mag  hydroxide-simeth (MAALOX/MYLANTA) 200-200-20 MG/5ML suspension 30 mL  30 mL Oral Q4H PRN Ambrose Finland, MD   30 mL at 12/30/14 0210  . buPROPion (WELLBUTRIN XL) 24 hr tablet 300 mg  300 mg Oral Daily Ambrose Finland, MD      . gabapentin (NEURONTIN) capsule 300 mg  300 mg Oral TID Ambrose Finland, MD      . Influenza vac split quadrivalent PF (FLUARIX) injection 0.5 mL  0.5 mL Intramuscular Tomorrow-1000 Nicholaus Bloom, MD      . magnesium hydroxide (MILK OF MAGNESIA) suspension 30 mL  30 mL Oral Daily PRN Ambrose Finland, MD      . nicotine polacrilex (NICORETTE) gum 2 mg  2 mg Oral PRN Nicholaus Bloom, MD   2 mg at 12/30/14 4696  . pantoprazole (PROTONIX) EC tablet 40 mg  40 mg Oral BID AC Ambrose Finland, MD      . pneumococcal 23 valent vaccine (PNU-IMMUNE) injection 0.5 mL  0.5 mL Intramuscular Tomorrow-1000 Nicholaus Bloom, MD      .  QUEtiapine (SEROQUEL) tablet 100 mg  100 mg Oral TID Ambrose Finland, MD      . QUEtiapine (SEROQUEL) tablet 50 mg  50 mg Oral TID Ambrose Finland, MD      . traZODone (DESYREL) tablet 150 mg  150 mg Oral QHS PRN Ambrose Finland, MD       PTA Medications: Prescriptions prior to admission  Medication Sig Dispense Refill Last Dose  . buPROPion (WELLBUTRIN XL) 300 MG 24 hr tablet Take 1 tablet (300 mg total) by mouth daily. 30 tablet 0   . gabapentin (NEURONTIN) 300 MG capsule Take 2 capsules (600 mg total) by mouth 4 (four) times daily -  before meals and at bedtime. 90 capsule 0   . pantoprazole (PROTONIX) 40 MG tablet Take 1 tablet (40 mg total) by mouth daily. 30 tablet 0   . QUEtiapine (SEROQUEL) 100 MG tablet Take 1 tablet (100 mg total) by mouth 3 (three) times daily with meals. 30 tablet 0   . QUEtiapine (SEROQUEL) 50 MG tablet Take 3 tablets (150 mg total) by mouth at bedtime. 30 tablet 0   . traZODone (DESYREL) 50 MG tablet Take 1 tablet (50 mg total) by mouth at bedtime. 30 tablet 0      Previous Psychotropic Medications: Yes   Substance Abuse History in the last 12 months:  Yes.      Consequences of Substance Abuse: Legal Consequences:  multiple prison times  Results for orders placed or performed during the hospital encounter of 12/27/14 (from the past 72 hour(s))  Occult blood card to lab, stool     Status: None   Collection Time: 12/27/14  2:46 PM  Result Value Ref Range   Fecal Occult Bld NEGATIVE NEGATIVE  Hematocrit     Status: None   Collection Time: 12/27/14  3:00 PM  Result Value Ref Range   HCT 39.5 39.0 - 52.0 %  Hemoglobin     Status: Abnormal   Collection Time: 12/27/14  3:00 PM  Result Value Ref Range   Hemoglobin 12.8 (L) 13.0 - 17.0 g/dL  Basic metabolic panel     Status: Abnormal   Collection Time: 12/28/14  5:40 AM  Result Value Ref Range   Sodium 141 135 - 145 mmol/L   Potassium 4.5 3.5 - 5.1 mmol/L   Chloride 106 101 - 111 mmol/L   CO2 28 22 - 32 mmol/L   Glucose, Bld 93 65 - 99 mg/dL   BUN 7 6 - 20 mg/dL   Creatinine, Ser 0.82 0.61 - 1.24 mg/dL   Calcium 8.6 (L) 8.9 - 10.3 mg/dL   GFR calc non Af Amer >60 >60 mL/min   GFR calc Af Amer >60 >60 mL/min    Comment: (NOTE) The eGFR has been calculated using the CKD EPI equation. This calculation has not been validated in all clinical situations. eGFR's persistently <60 mL/min signify possible Chronic Kidney Disease.    Anion gap 7 5 - 15  CBC     Status: Abnormal   Collection Time: 12/28/14  5:40 AM  Result Value Ref Range   WBC 4.3 4.0 - 10.5 K/uL   RBC 4.16 (L) 4.22 - 5.81 MIL/uL   Hemoglobin 12.4 (L) 13.0 - 17.0 g/dL   HCT 39.4 39.0 - 52.0 %   MCV 94.7 78.0 - 100.0 fL   MCH 29.8 26.0 - 34.0 pg   MCHC 31.5 30.0 - 36.0 g/dL   RDW 15.9 (H) 11.5 - 15.5 %   Platelets 134 (  L) 150 - 400 K/uL    Observation Level/Precautions:  15 minute checks  Laboratory:  reviewed labs  Psychotherapy:  groups  Medications:  Will restart his medication for depression, anxiety and pain as  above  Consultations:  none  Discharge Concerns: safety   Estimated LOS: 4-5 days  Other:     Psychological Evaluations: Yes   Treatment Plan Summary: Daily contact with patient to assess and evaluate symptoms and progress in treatment and Medication management  Medical Decision Making:  Review of Psycho-Social Stressors (1), Review or order clinical lab tests (1), Established Problem, Worsening (2), Review of Last Therapy Session (1), Review or order medicine tests (1), Review of Medication Regimen & Side Effects (2) and Review of New Medication or Change in Dosage (2)  I certify that inpatient services furnished can reasonably be expected to improve the patient's condition.   JONNALAGADDA,JANARDHAHA R. 9/25/201611:06 AM

## 2014-12-30 NOTE — Plan of Care (Signed)
Problem: Aggression Towards others,Towards Self, and or Destruction Goal: STG-Patient will comply with prescribed medication regimen (Patient will comply with prescribed medication regimen)  Outcome: Progressing Pt verbally aggressive to Clinical research associate. Prescribed medication administered

## 2014-12-30 NOTE — Progress Notes (Signed)
Patient ID: [name], male   DOB: December 09, 1980, 34 y.o.   MRN: [id number] D: Patient pleasant on approach. Pt mood and affect appeared depressed and anxious. Pt endorses passive suicidal ideation without a plan. Pt attended and participated in evening wrap up group. Cooperative with assessment.    A: Met with pt 1:1. Medications administered as prescribed. Support and encouragement provided to attend groups and engage in milieu. Pt encouraged to discuss feelings and come to staff with any question or concerns.   R: Patient remains safe and complaint with medications.

## 2014-12-30 NOTE — [facility] Group Notes (Signed)
[facility] Group Notes:  (Nursing/MHT/Case Management/Adjunct)  Date:  12/30/2014  Time:  10:18 AM  Type of Therapy:  Nurse Education  Participation Level:  Minimal  Participation Quality:  Attentive  Affect:  Anxious  Cognitive:  Appropriate  Insight:  Improving  Engagement in Group:  Engaged  Modes of Intervention:  Discussion and Education  Summary of Progress/Problems:  Group topic was [employer].  Discussed goals for today and for discharge.  Kohl reported that his goal for today was to get his medications straight.  He was attentive but didn't talk much in group.       Norm Parcel Latron Ribas 12/30/2014, 10:18 AM

## 2014-12-30 NOTE — Plan of Care (Signed)
Problem: Alteration in mood & ability to function due to Goal: LTG-Pt verbalizes understanding of importance of med regimen (Patient verbalizes understanding of importance of medication regimen and need to continue outpatient care and support groups)  Outcome: Progressing [name] was upset that he didn't have medications this morning.  He reports that "I have to take my medications."  Reassured him that he will see the doctor this morning to clarify medication regimen.

## 2014-12-30 NOTE — Progress Notes (Signed)
DAR Note: [name] has been visible on the unit.  He has been attending groups.  He denies any A/V hallucinations.  He reports that he always has suicidal thoughts but is able to contract for safety.  He reports that he is in pain 9/10 and reports this as "all over pain."  No pain medication requested.  He has been playful with the females on the unit.  Flirtatious at times with staff.  He has been easily redirectable.  He completed his self inventory and stated his depression is 10/10, hopelessness 9/10 and anxiety 10/10.  He was upset at the beginning of the shift when he didn't have his regular medication but was able to see the doctor.  He is now on his regular medications and feels like he is on the right track.  He reports that his goal for the day is to "get my medications correct."  He appears to be in no physical distress.  Pneumonia and Flu vaccine given per order.  He tolerated well.  Q 15 minute checks maintained for safety.  Encouraged him to participate in groups/unit activities.

## 2014-12-30 NOTE — Progress Notes (Signed)
Patient ID: Asiel Chrostowski, male   DOB: 03-27-1981, 34 y.o.   MRN: [id number] D: Patient in room on approach. Pt angry demanding his medication. "you don't want be to go off here, I can be unbearable'. "I don't want to flip off on you". Pt stated he was given higher doses of his medication on previous admission and wanted Dr Sabra Heck to be called in. Pt denies  pain. No acute distressed noted at this time.   A: Met with pt 1:1. Pt advised on the status of medication. Pt advised on been seen by provider in the morning about any changes. Medications administered as prescribed. Support and encouragement provided. Pt encouraged to discuss feelings and come to staff with any question or concerns.   R: Patient remains safe and complaint with medications. Pt calm and resting in his room.

## 2014-12-30 NOTE — [facility] Suicide Risk Assessment (Signed)
[facility] Admission Suicide Risk Assessment   Nursing information obtained from:    Demographic factors:    Current Mental Status:    Loss Factors:    Historical Factors:    Risk Reduction Factors:    Total Time spent with patient: 45 minutes Principal Problem: <principal problem not specified> Diagnosis:   Patient Active Problem List   Diagnosis Date Noted  . Depressed [F32.9] 12/29/2014  . Acute upper GI hemorrhage [K92.2] 12/28/2014  . Depression [F32.9] 12/28/2014  . Suicidal ideation [R45.851] 12/28/2014  . Esophagitis [K20.9] 12/28/2014  . Tobacco abuse [Z72.0] 12/28/2014  . Hematemesis [K92.0] 12/27/2014  . Peripheral neuropathy [G62.9] 12/27/2014  . Polysubstance abuse [F19.10] 12/27/2014     Continued Clinical Symptoms:  Alcohol Use Disorder Identification Test Final Score (AUDIT): 28 The "Alcohol Use Disorders Identification Test", Guidelines for Use in Primary Care, Second Edition.  World Science writer [facility]). Score between 0-7:  no or low risk or alcohol related problems. Score between 8-15:  moderate risk of alcohol related problems. Score between 16-19:  high risk of alcohol related problems. Score 20 or above:  warrants further diagnostic evaluation for alcohol dependence and treatment.   CLINICAL FACTORS:   Severe Anxiety and/or Agitation Depression:   Anhedonia Comorbid alcohol abuse/dependence Hopelessness Impulsivity Insomnia Recent sense of peace/wellbeing Severe Alcohol/Substance Abuse/Dependencies Chronic Pain Unstable or Poor Therapeutic Relationship Previous Psychiatric Diagnoses and Treatments Medical Diagnoses and Treatments/Surgeries   Musculoskeletal: Strength & Muscle Tone: within normal limits Gait & Station: normal Patient leans: N/A  Psychiatric Specialty Exam: Physical Exam  ROS  Blood pressure 115/69, pulse 112, temperature 97.9 F (36.6 C), temperature source Oral, resp. rate 17, height  (1.702 m), weight 71.668 kg  (158 lb), SpO2 97 %.Body mass index is 24.74 kg/(m^2).     COGNITIVE FEATURES THAT CONTRIBUTE TO RISK:  Closed-mindedness, Loss of executive function, Polarized thinking and Thought constriction (tunnel vision)    SUICIDE RISK:   Severe:  Frequent, intense, and enduring suicidal ideation, specific plan, no subjective intent, but some objective markers of intent (i.e., choice of lethal method), the method is accessible, some limited preparatory behavior, evidence of impaired self-control, severe dysphoria/symptomatology, multiple risk factors present, and few if any protective factors, particularly a lack of social support.  PLAN OF CARE: Admitted from [facility] after medically cleared for crisis evaluation, medication management and safety monitoring.   Medical Decision Making:  Review of Psycho-Social Stressors (1), Review or order clinical lab tests (1), Established Problem, Worsening (2), Review of Last Therapy Session (1), Review or order medicine tests (1), Review of Medication Regimen & Side Effects (2) and Review of New Medication or Change in Dosage (2)  I certify that inpatient services furnished can reasonably be expected to improve the patient's condition.   JONNALAGADDA,JANARDHAHA R. 12/30/2014, 11:06 AM

## 2014-12-31 MED ORDER — METHOCARBAMOL 750 MG PO TABS
750.0000 mg | ORAL_TABLET | Freq: Three times a day (TID) | ORAL | Status: DC | PRN
  Administered 2014-12-31 – 2015-01-04 (×9): 750 mg via ORAL
  Filled 2014-12-31 (×9): qty 1

## 2014-12-31 MED ORDER — MIRTAZAPINE 30 MG PO TABS
30.0000 mg | ORAL_TABLET | Freq: Every day | ORAL | Status: DC
  Administered 2014-12-31 – 2015-01-03 (×4): 30 mg via ORAL
  Filled 2014-12-31 (×3): qty 1
  Filled 2014-12-31: qty 14
  Filled 2014-12-31 (×2): qty 1

## 2014-12-31 MED ORDER — TRAMADOL HCL 50 MG PO TABS
50.0000 mg | ORAL_TABLET | Freq: Four times a day (QID) | ORAL | Status: DC | PRN
  Administered 2014-12-31 – 2015-01-04 (×11): 50 mg via ORAL
  Filled 2014-12-31 (×11): qty 1

## 2014-12-31 MED ORDER — ENSURE ENLIVE PO LIQD
237.0000 mL | Freq: Three times a day (TID) | ORAL | Status: DC
  Administered 2014-12-31 – 2015-01-04 (×7): 237 mL via ORAL

## 2014-12-31 MED ORDER — GABAPENTIN 300 MG PO CAPS
600.0000 mg | ORAL_CAPSULE | Freq: Three times a day (TID) | ORAL | Status: DC
  Administered 2014-12-31 – 2015-01-04 (×12): 600 mg via ORAL
  Filled 2014-12-31 (×15): qty 2

## 2014-12-31 MED ORDER — QUETIAPINE FUMARATE 50 MG PO TABS
150.0000 mg | ORAL_TABLET | Freq: Three times a day (TID) | ORAL | Status: DC
  Administered 2014-12-31 – 2015-01-04 (×12): 150 mg via ORAL
  Filled 2014-12-31 (×17): qty 3

## 2014-12-31 MED ORDER — GABAPENTIN 300 MG PO CAPS
600.0000 mg | ORAL_CAPSULE | Freq: Every day | ORAL | Status: DC
  Administered 2014-12-31 – 2015-01-03 (×4): 600 mg via ORAL
  Filled 2014-12-31 (×5): qty 2

## 2014-12-31 NOTE — Progress Notes (Signed)
[facility] MD Progress Note  12/31/2014 6:30 PM [name]  MRN:  [id number] Subjective:  Bentleigh was transferred back to our service. GI bleeding was secondary to a esophagitis. He states he needs to be back on his previous medication regime. States he was starting to feel better when he was transferred. He did not sleep too well last night. States he needs to go to a residential treatment program. He wants to get his life back together for himself and his kids.  Principal Problem: Substance induced mood disorder Diagnosis:   Patient Active Problem List   Diagnosis Date Noted  . Substance induced mood disorder [F19.94] 12/30/2014  . Depressed [F32.9] 12/29/2014  . Acute upper GI hemorrhage [K92.2] 12/28/2014  . Depression [F32.9] 12/28/2014  . Suicidal ideation [R45.851] 12/28/2014  . Esophagitis [K20.9] 12/28/2014  . Tobacco abuse [Z72.0] 12/28/2014  . Hematemesis [K92.0] 12/27/2014  . Peripheral neuropathy [G62.9] 12/27/2014  . Polysubstance abuse [F19.10] 12/27/2014   Total Time spent with patient: 30 minutes  Past Psychiatric History: [facility]  Past Medical History:  Past Medical History  Diagnosis Date  . Testicular torsion   . Alcohol abuse     Past Surgical History  Procedure Laterality Date  . Right arm surgery    . Surgery scrotal / testicular     Family History: History reviewed. No pertinent family history. Family Psychiatric  History: Social History:  History  Alcohol Use  . Yes    Comment: last drink 12/20/2014     History  Drug Use  . Yes  . Special: Marijuana    Social History   Social History  . Marital Status: Married    Spouse Name: N/A  . Number of Children: N/A  . Years of Education: N/A   Social History Main Topics  . Smoking status: Current Every Day Smoker    Types: Cigarettes  . Smokeless tobacco: Never Used  . Alcohol Use: Yes     Comment: last drink 12/20/2014  . Drug Use: Yes    Special: Marijuana  . Sexual Activity: Yes   Other Topics  Concern  . None   Social History Narrative   Additional Social History:                         Sleep: Poor  Appetite:  Fair  Current Medications: Current Facility-Administered Medications  Medication Dose Route Frequency Provider Last Rate Last Dose  . acetaminophen (TYLENOL) tablet 650 mg  650 mg Oral Q6H PRN Leata Mouse, MD   650 mg at 12/31/14 1447  . alum & mag hydroxide-simeth (MAALOX/MYLANTA) 200-200-20 MG/5ML suspension 30 mL  30 mL Oral Q4H PRN Leata Mouse, MD   30 mL at 12/30/14 0210  . buPROPion (WELLBUTRIN XL) 24 hr tablet 300 mg  300 mg Oral Daily Leata Mouse, MD   300 mg at 12/31/14 0828  . feeding supplement (ENSURE ENLIVE) (ENSURE ENLIVE) liquid 237 mL  237 mL Oral TID BM Rachael Fee, MD   237 mL at 12/31/14 1445  . gabapentin (NEURONTIN) capsule 600 mg  600 mg Oral TID Rachael Fee, MD   600 mg at 12/31/14 1654  . gabapentin (NEURONTIN) capsule 600 mg  600 mg Oral QHS Rachael Fee, MD      . magnesium hydroxide (MILK OF MAGNESIA) suspension 30 mL  30 mL Oral Daily PRN Leata Mouse, MD      . methocarbamol (ROBAXIN) tablet 750 mg  750 mg Oral  Q8H PRN Rachael Fee, MD      . mirtazapine (REMERON) tablet 30 mg  30 mg Oral QHS Rachael Fee, MD      . nicotine polacrilex (NICORETTE) gum 2 mg  2 mg Oral PRN Rachael Fee, MD   2 mg at 12/31/14 1809  . pantoprazole (PROTONIX) EC tablet 40 mg  40 mg Oral BID AC Leata Mouse, MD   40 mg at 12/31/14 1656  . QUEtiapine (SEROQUEL) tablet 150 mg  150 mg Oral TID Rachael Fee, MD   150 mg at 12/31/14 1654  . QUEtiapine (SEROQUEL) tablet 200 mg  200 mg Oral QHS Worthy Flank, NP   200 mg at 12/30/14 2207  . traMADol (ULTRAM) tablet 50 mg  50 mg Oral Q6H PRN Rachael Fee, MD   50 mg at 12/31/14 1809    Lab Results: No results found for this or any previous visit (from the past 48 hour(s)).  Physical Findings: AIMS:  , ,  ,  ,    CIWA:    COWS:      Musculoskeletal: Strength & Muscle Tone: within normal limits Gait & Station: normal Patient leans: normal  Psychiatric Specialty Exam: Review of Systems  Constitutional: Positive for malaise/fatigue.  HENT: Negative.   Eyes: Negative.   Respiratory: Negative.   Cardiovascular: Negative.   Gastrointestinal: Negative.   Genitourinary: Negative.   Musculoskeletal: Positive for joint pain.  Skin: Negative.   Neurological: Positive for weakness.  Endo/Heme/Allergies: Negative.   Psychiatric/Behavioral: Positive for depression and substance abuse. The patient is nervous/anxious and has insomnia.     Blood pressure 118/61, pulse 112, temperature 97.8 F (36.6 C), temperature source Oral, resp. rate 16, height  (1.702 m), weight 71.668 kg (158 lb), SpO2 97 %.Body mass index is 24.74 kg/(m^2).  General Appearance: Fairly Groomed  Patent attorney::  Fair  Speech:  Clear and Coherent  Volume:  Normal  Mood:  Anxious and Depressed  Affect:  anxious worried in pain  Thought Process:  Coherent and Goal Directed  Orientation:  Full (Time, Place, and Person)  Thought Content:  symptoms events worries concerns  Suicidal Thoughts:  No  Homicidal Thoughts:  No  Memory:  Immediate;   Fair Recent;   Fair Remote;   Fair  Judgement:  Fair  Insight:  Present and Shallow  Psychomotor Activity:  Restlessness  Concentration:  Fair  Recall:  [employer] of Knowledge:Fair  Language: Fair  Akathisia:  No  Handed:  Right  AIMS (if indicated):     Assets:  Desire for Improvement  ADL's:  Intact  Cognition: WNL  Sleep:  Number of Hours: 4   Treatment Plan Summary: Daily contact with patient to assess and evaluate symptoms and progress in treatment and Medication management Supportive approach/coping skills/relapse prevention Polysubstance abuse; work a relapse prevention plan Depression; continue the Wellbutrin XL 300 mg in AM Chronic Pain with increase  the Neurontin 600 mg TID and  HS/Robaxin 750 mg Mood instability; will continue to work with the Seroquel 150 mg TID 200 mg HS Has acute shoulder pain; will use Tramadol 50 mg Q 6 PRN Will explore residential treatment options LUGO,IRVING A 12/31/2014, 6:30 PM

## 2014-12-31 NOTE — Progress Notes (Signed)
DAR Note: [name] has been visible on the unit.  He did not attend morning groups.  He reports that he isn't doing well.  "I just need to get my medications straight, the weekend doctor didn't do my medications right."  Reassured him that he will be speaking with Dr. Dub Mikes and he can discuss that with him.  He completed his self inventory and reports that his depression is 8/10, hopelessness 5/10 and anxiety 8/10.  He was happy that his medications have been adjusted.  Reviewed the changes.  He continues to report shoulder pain.  8/10 an tylenol and robaxin given prn with minimal relief.  He denies any homicidal ideation.  He reports passive suicidal thoughts and is able to contract for safety.  Encouraged him to participate in group/unit activities.  Q 15 minute checks maintained for safety.  We will continue to monitor the progress towards his goals.
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
## 2014-12-31 NOTE — Progress Notes (Signed)
Patient attended AA group meeting tonight.  

## 2014-12-31 NOTE — Tx Team (Signed)
Interdisciplinary Treatment Plan Update (Adult)  Date:  12/31/2014  Time Reviewed:  8:41 AM   Progress in Treatment: Attending groups: No. Participating in groups:  No. Taking medication as prescribed:  Yes. Tolerating medication:  Yes. Family/Significant othe contact made:  SPE required for this pt.  Patient understands diagnosis:  Yes. and As evidenced by:  seeking treatment for SI with recent attempt, depression, alcohol/marijuana abuse, and medication stabilization. Discussing patient identified problems/goals with staff:  Yes. Medical problems stabilized or resolved:  Yes. Denies suicidal/homicidal ideation: Yes. Issues/concerns per patient self-inventory:  Other:  Discharge Plan or Barriers: CSW assessing for appropriate referrals. Per MD, pt interested in residential treatment.   Reason for Continuation of Hospitalization: Depression Medication stabilization Withdrawal symptoms  Comments:  [name] is an 34 y.o. male that presents to [facility] self-referred after reporting SI. Pt stated he tried to kill himself by jumping off a two story building last night. Pt stated he still feels suicidal. Pt stated he also feels homicidal "to those that f with my head," and pt would not elaborate on this. Pt reports he hears voices telling him to kill himself. Pt has a hx of trying to cut his wrists "a [place] time ago" by report. Pt endorses sx of depression, stating he drinks because he is depressed.Pt is not currently on any medications by report. Pt stated he drinks and smokes marijuana regularly, reporting drinking one half gallon liquor daily, last drank at 5 pm last night, and smokes 3 blunts per day. Pt stated current withdrawal sx include nausea, headache, tremor. Pt stated he is homeless and has no support. Pt stated he has court charges coming up for misdemeanors and felony charges, but that his court dates are continued for October and November. Pt admits to being in prison  before and having a violent hx. Pt was oriented x 3, appeared depressed, had logical/coherent thought processes, good eye contact, normal speech, reported he was in pain from the fall from the roof on his right shoulder, was in scrubs, pleasant. Diagnosis upon admission: Major Depressive Disorder, Recurrent, Moderate, 303.90 Alcohol Use Disorder, Severe, 304.30 Cannabis Use Disorder, Severe  Estimated length of stay:  3-5 days   New goal(s): to formulate effective aftercare plan.   Additional Comments:  Patient and CSW reviewed pt's identified goals and treatment plan. Patient verbalized understanding and agreed to treatment plan. CSW reviewed [facility] "Discharge Process and Patient Involvement" Form. Pt verbalized understanding of information provided and signed form.    Review of initial/current patient goals per problem list:  1. Goal(s): Patient will participate in aftercare plan  Met: No.   Target date: at discharge  As evidenced by: Patient will participate within aftercare plan AEB aftercare provider and housing plan at discharge being identified.  9/26: Pt did not attend morning discharge planning group. CSW assessing for appropriate referrals.   2. Goal (s): Patient will exhibit decreased depressive symptoms and suicidal ideations.  Met: No.    Target date: at discharge  As evidenced by: Patient will utilize self rating of depression at 3 or below and demonstrate decreased signs of depression or be deemed stable for discharge by MD.  9/26: Pt rates depression as high and presents with depressed mood. Denies SI/HI/AVH.    3. Goal(s): Patient will demonstrate decreased signs of withdrawal due to substance abuse  Met: No.   Target date:at discharge   As evidenced by: Patient will produce a CIWA/COWS score of 0, have stable vitals signs, and no  symptoms of withdrawal.  9/26: Pt reports mild withdrawal with high pulse. No CIWA score taken today.     Attendees: Patient:   12/31/2014 8:41 AM   Family:   12/31/2014 8:41 AM   Physician:  Dr. Carlton Adam, MD 12/31/2014 8:41 AM   Nursing:   Gari Crown RN 12/31/2014 8:41 AM   Clinical Social Worker: Maxie Better, [place]  12/31/2014 8:41 AM   Clinical Social Worker:  Peri Maris LCSWA 12/31/2014 8:41 AM   Other:  Gerline Legacy Nurse Case Manager 12/31/2014 8:41 AM   Other:  Lucinda Dell; [facility]  12/31/2014 8:41 AM   Other:   12/31/2014 8:41 AM   Other:  12/31/2014 8:41 AM   Other:  12/31/2014 8:41 AM   Other:  12/31/2014 8:41 AM    12/31/2014 8:41 AM    12/31/2014 8:41 AM    12/31/2014 8:41 AM    12/31/2014 8:41 AM    Scribe for Treatment Team:   Maxie Better, [place]  12/31/2014 8:41 AM

## 2014-12-31 NOTE — [facility] Group Notes (Signed)
[facility] LCSW Group Therapy  12/31/2014 12:22 PM  Type of Therapy:  Group Therapy  Participation Level:  Did Not Attend-meeting with MD during group. Excused.   Modes of Intervention:  Confrontation, Discussion, Education, Exploration, Problem-solving, Rapport Building, Socialization and Support  Summary of Progress/Problems: Today's Topic: Overcoming Obstacles. Patients identified one short term goal and potential obstacles in reaching this goal. Patients processed barriers involved in overcoming these obstacles. Patients identified steps necessary for overcoming these obstacles and explored motivation (internal and external) for facing these difficulties head on.   Smart, Mora Pedraza LCSWA  12/31/2014, 12:22 PM

## 2014-12-31 NOTE — Progress Notes (Signed)
Recreation Therapy Notes  Date: 09.26.2016 Time: 9:30am Location: [address] Group Room   Group Topic: Stress Management  Goal Area(s) Addresses:  Patient will actively participate in stress management techniques presented during session.   Behavioral Response: Did not attend.   Denise L Blanchfield, LRT/CTRS        Blanchfield, Denise L 12/31/2014 1:14 PM 

## 2014-12-31 NOTE — [facility] Group Notes (Signed)
[facility] LCSW Aftercare Discharge Planning Group Note   12/31/2014 11:31 AM  Participation Quality:  Invited- DID NOT ATTEND. Pt sleeping in room during group. CSW will attempt to meet with pt individually this afternoon. (PSA/aftercare)  Smart, Lebron Quam

## 2014-12-31 NOTE — Plan of Care (Signed)
Problem: Alteration in mood & ability to function due to Goal: STG-Patient will comply with prescribed medication regimen (Patient will comply with prescribed medication regimen)  Outcome: Progressing Pt compliant with medication regime     

## 2015-01-01 ENCOUNTER — Encounter (HOSPITAL_COMMUNITY): Payer: Self-pay | Admitting: Gastroenterology

## 2015-01-01 NOTE — Plan of Care (Signed)
Problem: Alteration in mood & ability to function due to Goal: STG-Patient will attend groups Outcome: Progressing Pt attended a participated in AA group

## 2015-01-01 NOTE — [facility] Group Notes (Signed)
[facility] LCSW Group Therapy  01/01/2015 3:53 PM  Type of Therapy:  Group Therapy  Participation Level:  Active  Participation Quality:  Attentive  Affect:  Appropriate  Cognitive:  Oriented  Insight:  Improving  Engagement in Therapy:  Improving  Modes of Intervention:  Discussion, Education, Exploration, Problem-solving, Rapport Building, Socialization and Support  Summary of Progress/Problems: MHA Speaker came to talk about his personal journey with substance abuse and addiction. The pt processed ways by which to relate to the speaker. MHA speaker provided handouts and educational information pertaining to groups and services offered by the [facility].   Smart, Heather LCSWA  01/01/2015, 3:53 PM

## 2015-01-01 NOTE — Progress Notes (Signed)
Pt did not attend the evening AA speaker meeting. Pt stayed in his room and slept. 

## 2015-01-01 NOTE — Progress Notes (Signed)
Patient ID: [name], male   DOB: December 09, 1980, 34 y.o.   MRN: [id number] D: Patient pleasant on approach. Pt mood and affect appeared depressed and anxious. Pt endorses passive suicidal ideation without a plan. Pt attended and participated in evening wrap up group. Cooperative with assessment.    A: Met with pt 1:1. Medications administered as prescribed. Support and encouragement provided to attend groups and engage in milieu. Pt encouraged to discuss feelings and come to staff with any question or concerns.   R: Patient remains safe and complaint with medications.

## 2015-01-01 NOTE — [facility] Suicide Risk Assessment (Signed)
[facility] INPATIENT:  Family/Significant Other Suicide Prevention Education  Suicide Prevention Education:  Patient Refusal for Family/Significant Other Suicide Prevention Education: The patient [name] has refused to provide written consent for family/significant other to be provided Family/Significant Other Suicide Prevention Education during admission and/or prior to discharge.  Physician notified.  SPE completed with pt, as pt refused to consent to family contact. SPI pamphlet provided to pt and pt was encouraged to share information with support network, ask questions, and talk about any concerns relating to SPE. Pt denies access to guns/firearms and verbalized understanding of information provided. Mobile Crisis information also provided to pt.   Smart, Heather LCSWA  01/01/2015, 9:13 AM

## 2015-01-01 NOTE — Progress Notes (Signed)
DAR Note: Patient presents with depressed mood and flat affect.  Complain of shoulder pain and muscle spasm.  Denies visual and auditory hallucinations.  Reports suicidal thoughts but able to contracts for safety.  Rates anxiety at 9/10, hopelessness and depression at 7/10.  Maintained on routine safety checks.  Medications given as prescribed.  Support and encouragement offered as needed.

## 2015-01-01 NOTE — Progress Notes (Signed)
[facility] Group Notes:  (Nursing/MHT/Case Management/Adjunct)  Date:  01/01/2015  Time:  10:24 AM  Type of Therapy:  Psychoeducational Skills  Participation Level:  Active  Participation Quality:  Appropriate, Attentive, Sharing and Supportive  Affect:  Appropriate  Cognitive:  Appropriate  Insight:  Appropriate and Good  Engagement in Group:  Developing/Improving, Engaged and Supportive  Modes of Intervention:  Discussion and Education  Summary of Progress/Problems: Pt attended group and participated during group. Group discussion was recovery goals. Pt shared why he was here for his addiction to drugs & his goals for recovery. Pt was attentive by nodding his head at times and sharing information when he was agreeing with information shared.  [name] 01/01/2015, 10:24 AM

## 2015-01-01 NOTE — Progress Notes (Signed)
[facility] MD Progress Note  01/01/2015 7:02 PM [name]  MRN:  [id number] Subjective:  [name] is having a hard time. He is in bed. States he is having more shoulder pain. The pain has been getting him up during the night. He has not received a call from the lawyer. States he wants to pursue a residential treatment program. States the way he feels today if he was out there he would by now been using and getting in trouble. States that he really needs help Principal Problem: Substance induced mood disorder Diagnosis:   Patient Active Problem List   Diagnosis Date Noted  . Substance induced mood disorder [F19.94] 12/30/2014  . Depressed [F32.9] 12/29/2014  . Acute upper GI hemorrhage [K92.2] 12/28/2014  . Depression [F32.9] 12/28/2014  . Suicidal ideation [R45.851] 12/28/2014  . Esophagitis [K20.9] 12/28/2014  . Tobacco abuse [Z72.0] 12/28/2014  . Hematemesis [K92.0] 12/27/2014  . Peripheral neuropathy [G62.9] 12/27/2014  . Polysubstance abuse [F19.10] 12/27/2014   Total Time spent with patient: 30 minutes  Past Psychiatric History: see Admission H and PE  Past Medical History:  Past Medical History  Diagnosis Date  . Testicular torsion   . Alcohol abuse     Past Surgical History  Procedure Laterality Date  . Right arm surgery    . Surgery scrotal / testicular    . Esophagogastroduodenoscopy N/A 12/28/2014    Procedure: ESOPHAGOGASTRODUODENOSCOPY (EGD);  Surgeon: Jeani Hawking, MD;  Location: [facility];  Service: Endoscopy;  Laterality: N/A;   Family History: History reviewed. No pertinent family history. Family Psychiatric  History: See Admission H and P Social History:  History  Alcohol Use  . Yes    Comment: last drink 12/20/2014     History  Drug Use  . Yes  . Special: Marijuana    Social History   Social History  . Marital Status: Married    Spouse Name: N/A  . Number of Children: N/A  . Years of Education: N/A   Social History Main Topics  . Smoking status:  Current Every Day Smoker    Types: Cigarettes  . Smokeless tobacco: Never Used  . Alcohol Use: Yes     Comment: last drink 12/20/2014  . Drug Use: Yes    Special: Marijuana  . Sexual Activity: Yes   Other Topics Concern  . None   Social History Narrative   Additional Social History:                         Sleep: Poor  Appetite:  Poor  Current Medications: Current Facility-Administered Medications  Medication Dose Route Frequency Provider Last Rate Last Dose  . acetaminophen (TYLENOL) tablet 650 mg  650 mg Oral Q6H PRN Leata Mouse, MD   650 mg at 12/31/14 1447  . alum & mag hydroxide-simeth (MAALOX/MYLANTA) 200-200-20 MG/5ML suspension 30 mL  30 mL Oral Q4H PRN Leata Mouse, MD   30 mL at 01/01/15 0004  . buPROPion (WELLBUTRIN XL) 24 hr tablet 300 mg  300 mg Oral Daily Leata Mouse, MD   300 mg at 01/01/15 0813  . feeding supplement (ENSURE ENLIVE) (ENSURE ENLIVE) liquid 237 mL  237 mL Oral TID BM Rachael Fee, MD   237 mL at 01/01/15 1426  . gabapentin (NEURONTIN) capsule 600 mg  600 mg Oral TID Rachael Fee, MD   600 mg at 01/01/15 1626  . gabapentin (NEURONTIN) capsule 600 mg  600 mg Oral QHS Rachael Fee, MD  600 mg at 12/31/14 2117  . magnesium hydroxide (MILK OF MAGNESIA) suspension 30 mL  30 mL Oral Daily PRN Leata Mouse, MD      . methocarbamol (ROBAXIN) tablet 750 mg  750 mg Oral Q8H PRN Rachael Fee, MD   750 mg at 01/01/15 1426  . mirtazapine (REMERON) tablet 30 mg  30 mg Oral QHS Rachael Fee, MD   30 mg at 12/31/14 2117  . nicotine polacrilex (NICORETTE) gum 2 mg  2 mg Oral PRN Rachael Fee, MD   2 mg at 01/01/15 1814  . pantoprazole (PROTONIX) EC tablet 40 mg  40 mg Oral BID AC Leata Mouse, MD   40 mg at 01/01/15 1625  . QUEtiapine (SEROQUEL) tablet 150 mg  150 mg Oral TID Rachael Fee, MD   150 mg at 01/01/15 1625  . QUEtiapine (SEROQUEL) tablet 200 mg  200 mg Oral QHS Worthy Flank, NP    200 mg at 12/31/14 2117  . traMADol (ULTRAM) tablet 50 mg  50 mg Oral Q6H PRN Rachael Fee, MD   50 mg at 01/01/15 1210    Lab Results: No results found for this or any previous visit (from the past 48 hour(s)).  Physical Findings: AIMS:  , ,  ,  ,    CIWA:    COWS:     Musculoskeletal: Strength & Muscle Tone: within normal limits Gait & Station: normal Patient leans: normal  Psychiatric Specialty Exam: Review of Systems  Constitutional: Positive for malaise/fatigue.  HENT: Negative.   Eyes: Negative.   Respiratory: Negative.   Cardiovascular: Negative.   Gastrointestinal: Negative.   Genitourinary: Negative.   Musculoskeletal: Positive for joint pain.  Skin: Negative.   Neurological: Positive for weakness.  Endo/Heme/Allergies: Negative.   Psychiatric/Behavioral: Positive for depression and substance abuse. The patient is nervous/anxious.     Blood pressure 112/81, pulse 101, temperature 98.2 F (36.8 C), temperature source Oral, resp. rate 18, height  (1.702 m), weight 71.668 kg (158 lb), SpO2 97 %.Body mass index is 24.74 kg/(m^2).  General Appearance: Fairly Groomed  Patent attorney::  Fair  Speech:  Clear and Coherent  Volume:  Decreased  Mood:  Anxious and Depressed  Affect:  Depressed and in pain   Thought Process:  Coherent and Goal Directed  Orientation:  Full (Time, Place, and Person)  Thought Content:  symptoms events worries concerns  Suicidal Thoughts:  No  Homicidal Thoughts:  No  Memory:  Immediate;   Fair Recent;   Fair Remote;   Fair  Judgement:  Fair  Insight:  Present and Shallow  Psychomotor Activity:  Restlessness  Concentration:  Fair  Recall:  [employer] of Knowledge:Fair  Language: Fair  Akathisia:  No  Handed:  Right  AIMS (if indicated):     Assets:  Desire for Improvement  ADL's:  Intact  Cognition: WNL  Sleep:  Number of Hours: 5.75   Treatment Plan Summary: Daily contact with patient to assess and evaluate symptoms and  progress in treatment and Medication management Supportive approach/coping skills Polysubstance abuse; will continue to work a relapse prevention plan Pain; continue the Neurontin and the Tramadol PRN Depression; continue the Wellbutrin Xl 300 mg AM Insomnia; continue the Remeron 30 mg HS Mood instability; continue the Seroquel 150 mg TID and 200 mg HS Use CBT/mindfulness Explore residential treatment options LUGO,IRVING A 01/01/2015, 7:02 PM

## 2015-01-01 NOTE — Clinical Social Work Note (Signed)
[facility] and [facility] Referrals made this morning per pt request.   Trula Slade, LCSWA Clinical Social Worker 01/01/2015 9:12 AM

## 2015-01-02 NOTE — Plan of Care (Signed)
Problem: Alteration in mood Goal: STG-Patient is able to discuss feelings and issues (Patient is able to discuss feelings and issues leading to depression)  Outcome: Not Progressing Pt reports feeling irritable and has been isolating from peers

## 2015-01-02 NOTE — Progress Notes (Signed)
[facility] MD Progress Note  01/02/2015 6:38 PM [name]  MRN:  [id number] Subjective:  Christopherjame states he is having a hard time. He has been isolating avoiding interaction as he is afraid of going off on other patients. States he feels agitates. States he is frustrated as nothing seems to be working when it comes to finding a residential treatment program to go to. States if he was out at shelter he would be around drugs and alcohol and states it is too soon to expect that he can refuse it. States he does not want to go back to the same behaviors that got him in trouble before.  Principal Problem: Substance induced mood disorder Diagnosis:   Patient Active Problem List   Diagnosis Date Noted  . Substance induced mood disorder [F19.94] 12/30/2014  . Depressed [F32.9] 12/29/2014  . Acute upper GI hemorrhage [K92.2] 12/28/2014  . Depression [F32.9] 12/28/2014  . Suicidal ideation [R45.851] 12/28/2014  . Esophagitis [K20.9] 12/28/2014  . Tobacco abuse [Z72.0] 12/28/2014  . Hematemesis [K92.0] 12/27/2014  . Peripheral neuropathy [G62.9] 12/27/2014  . Polysubstance abuse [F19.10] 12/27/2014   Total Time spent with patient: 30 minutes  Past Psychiatric History: See  Admission H and PE  Past Medical History:  Past Medical History  Diagnosis Date  . Testicular torsion   . Alcohol abuse     Past Surgical History  Procedure Laterality Date  . Right arm surgery    . Surgery scrotal / testicular    . Esophagogastroduodenoscopy N/A 12/28/2014    Procedure: ESOPHAGOGASTRODUODENOSCOPY (EGD);  Surgeon: Jeani Hawking, MD;  Location: [facility];  Service: Endoscopy;  Laterality: N/A;   Family History: History reviewed. No pertinent family history. Family Psychiatric  History: See Admission H and PE Social History:  History  Alcohol Use  . Yes    Comment: last drink 12/20/2014     History  Drug Use  . Yes  . Special: Marijuana    Social History   Social History  . Marital Status: Married     Spouse Name: N/A  . Number of Children: N/A  . Years of Education: N/A   Social History Main Topics  . Smoking status: Current Every Day Smoker    Types: Cigarettes  . Smokeless tobacco: Never Used  . Alcohol Use: Yes     Comment: last drink 12/20/2014  . Drug Use: Yes    Special: Marijuana  . Sexual Activity: Yes   Other Topics Concern  . None   Social History Narrative   Additional Social History:                         Sleep: Poor  Appetite:  Poor  Current Medications: Current Facility-Administered Medications  Medication Dose Route Frequency Provider Last Rate Last Dose  . acetaminophen (TYLENOL) tablet 650 mg  650 mg Oral Q6H PRN Leata Mouse, MD   650 mg at 12/31/14 1447  . alum & mag hydroxide-simeth (MAALOX/MYLANTA) 200-200-20 MG/5ML suspension 30 mL  30 mL Oral Q4H PRN Leata Mouse, MD   30 mL at 01/01/15 0004  . buPROPion (WELLBUTRIN XL) 24 hr tablet 300 mg  300 mg Oral Daily Leata Mouse, MD   300 mg at 01/02/15 0749  . feeding supplement (ENSURE ENLIVE) (ENSURE ENLIVE) liquid 237 mL  237 mL Oral TID BM Rachael Fee, MD   237 mL at 01/02/15 1327  . gabapentin (NEURONTIN) capsule 600 mg  600 mg Oral TID Reymundo Poll  Dub Mikes, MD   600 mg at 01/02/15 1636  . gabapentin (NEURONTIN) capsule 600 mg  600 mg Oral QHS Rachael Fee, MD   600 mg at 01/01/15 2103  . magnesium hydroxide (MILK OF MAGNESIA) suspension 30 mL  30 mL Oral Daily PRN Leata Mouse, MD      . methocarbamol (ROBAXIN) tablet 750 mg  750 mg Oral Q8H PRN Rachael Fee, MD   750 mg at 01/02/15 1137  . mirtazapine (REMERON) tablet 30 mg  30 mg Oral QHS Rachael Fee, MD   30 mg at 01/01/15 2103  . nicotine polacrilex (NICORETTE) gum 2 mg  2 mg Oral PRN Rachael Fee, MD   2 mg at 01/02/15 1636  . pantoprazole (PROTONIX) EC tablet 40 mg  40 mg Oral BID AC Leata Mouse, MD   40 mg at 01/02/15 1636  . QUEtiapine (SEROQUEL) tablet 150 mg  150 mg Oral TID  Rachael Fee, MD   150 mg at 01/02/15 1636  . QUEtiapine (SEROQUEL) tablet 200 mg  200 mg Oral QHS Worthy Flank, NP   200 mg at 01/01/15 2103  . traMADol (ULTRAM) tablet 50 mg  50 mg Oral Q6H PRN Rachael Fee, MD   50 mg at 01/02/15 1636    Lab Results: No results found for this or any previous visit (from the past 48 hour(s)).  Physical Findings: AIMS: Facial and Oral Movements Muscles of Facial Expression: None, normal Lips and Perioral Area: None, normal Jaw: None, normal Tongue: None, normal,Extremity Movements Upper (arms, wrists, hands, fingers): None, normal Lower (legs, knees, ankles, toes): None, normal, Trunk Movements Neck, shoulders, hips: None, normal, Overall Severity Severity of abnormal movements (highest score from questions above): None, normal Incapacitation due to abnormal movements: None, normal Patient's awareness of abnormal movements (rate only patient's report): No Awareness, Dental Status Current problems with teeth and/or dentures?: No Does patient usually wear dentures?: No  CIWA:    COWS:     Musculoskeletal: Strength & Muscle Tone: within normal limits Gait & Station: normal Patient leans: normal  Psychiatric Specialty Exam: Review of Systems  Constitutional: Negative.   HENT: Negative.   Eyes: Negative.   Cardiovascular: Negative.   Gastrointestinal: Negative.   Genitourinary: Negative.   Musculoskeletal: Positive for joint pain.  Skin: Negative.   Neurological: Negative.   Endo/Heme/Allergies: Negative.   Psychiatric/Behavioral: Positive for depression and substance abuse. The patient is nervous/anxious and has insomnia.     Blood pressure 120/87, pulse 106, temperature 97.8 F (36.6 C), temperature source Oral, resp. rate 18, height  (1.702 m), weight 71.668 kg (158 lb), SpO2 97 %.Body mass index is 24.74 kg/(m^2).  General Appearance: Fairly Groomed  Patent attorney::  Fair  Speech:  Clear and Coherent  Volume:  fluctuates   Mood:  Anxious, Depressed, Dysphoric and Irritable  Affect:  Labile  Thought Process:  Coherent and Goal Directed  Orientation:  Full (Time, Place, and Person)  Thought Content:  symptoms events worries concerns  Suicidal Thoughts:  No  Homicidal Thoughts:  No  Memory:  Immediate;   Fair Recent;   Fair Remote;   Fair  Judgement:  Fair  Insight:  Shallow  Psychomotor Activity:  Restlessness  Concentration:  Fair  Recall:  [employer] of Knowledge:Fair  Language: Fair  Akathisia:  No  Handed:  Right  AIMS (if indicated):     Assets:  Desire for Improvement  ADL's:  Intact  Cognition: WNL  Sleep:  Number of Hours: 4   Treatment Plan Summary: Daily contact with patient to assess and evaluate symptoms and progress in treatment and Medication management Supportive approach/coping skills Substance dependence; continue to work a relapse prevention plan Mood instability; continue to work with the Seroquel 150 mg TID 200 mg HS Pain; continue to work with the neurontin and the Ultram Depression; continue to work with the Wellbutrin XL 300 mg in AM Insomnia; continue to work with the Remeron Agitation; encourage to use the relaxation/mindfulness techniques he has learned Continue to explore residential treatment options LUGO,IRVING A 01/02/2015, 6:38 PM

## 2015-01-02 NOTE — Progress Notes (Signed)
D- Patient is depressed and anxious.  Currently denies SI. Patient reports HI towards no particular person.  Patient reports agitation and irritability.  Patient verbalizes, poor sleep, shoulder pain, and rates his depression "20", feelings of hopelessness "25", and "20" with 10 being the worst. Patient initially was observed in the milieu interacting with others but began to isolate himself in his room as a coping skill for increased agitation with others.  Patient would like to speak to someone in regards to Orf-term placement.  Patient stated "If I walk out of this door right now, it ain't gonna be good" in response to his readiness for discharge. Patient's affect brightened towards the end of the shift.  Patient was observed laughing and telling jokes but remained to verbalize agitation to nurse.   A- PRN and scheduled medications administered to patient, per MD orders. Support and encouragement provided.  Routine safety checks conducted every 15 minutes.  Patient informed to notify staff with problems or concerns. R- No adverse drug reactions noted. Patient contracts for safety at this time. Patient compliant with medications and treatment plan. Patient receptive, agitated, and cooperative.  Patient remains safe at this time.

## 2015-01-02 NOTE — Clinical Social Work Note (Signed)
CSW gave pt [facility] (Tee Gray)'s contact information. Pt encouraged to call. Pt asked Tee to stop by and visit him today/this evening in order to discuss possibility of him being accepted into his halfway house.   Trula Slade, LCSWA Clinical Social Worker 01/02/2015 4:22 PM

## 2015-01-02 NOTE — [facility] Group Notes (Signed)
[facility] LCSW Group Therapy  01/02/2015 1:09 PM  Type of Therapy:  Group Therapy  Participation Level:  Active  Participation Quality:  Attentive  Affect:  Appropriate  Cognitive:  Alert and Oriented  Insight:  Engaged  Engagement in Therapy:  Engaged  Modes of Intervention:  Confrontation, Discussion, Education, Exploration, Problem-solving, Rapport Building, Socialization and Support  Summary of Progress/Problems: Today's Topic: Overcoming Obstacles. Patients identified one short term goal and potential obstacles in reaching this goal. Patients processed barriers involved in overcoming these obstacles. Patients identified steps necessary for overcoming these obstacles and explored motivation (internal and external) for facing these difficulties head on. [name] was attentive and engaged during today's processing group. He shared that his biggest obstacle is dealing with court issues while trying to get into treatment. "I can't go to a shelter. It's just no good for me." Other group members offered advice and reviewed other options with pt and CSW. Pt given information to Winferd Humphrey ([facility]) and feels hopeful that Rhae Hammock may be able to allow him in his halfway house being that he is employed.   Smart, Heather LCSWA 01/02/2015, 1:09 PM

## 2015-01-02 NOTE — Progress Notes (Signed)
Pt attended NA group this evening.  

## 2015-01-02 NOTE — Progress Notes (Signed)
Patient ID: [name], male   DOB: 09/14/1980, 33 y.o.   MRN: [id number] D: Patient pleasant on approach. Reports feeling irritable and does not want to be around peers. Pt mood and affect appeared depressed and anxious. Pt endorses passive suicidal ideation without a plan. Cooperative with assessment.   A: Met with pt 1:1. Medications administered as prescribed. Support and encouragement provided to attend groups and engage in milieu. Pt encouraged to discuss feelings and come to staff with any question or concerns.  R: Patient remains safe and complaint with medications.  

## 2015-01-02 NOTE — [facility] Group Notes (Signed)
[facility] LCSW Aftercare Discharge Planning Group Note   01/02/2015 10:38 AM  Participation Quality:  Minimal   Mood/Affect:  Depressed  Depression Rating:  10  Anxiety Rating:  10  Thoughts of Suicide:  No Will you contract for safety?   NA  Current AVH:  No  Plan for Discharge/Comments:  Pt reports that dep/anx are still high. Pt worried about pending court dates. Still did not contact caring services "I don't want to stay in a shelter, and they require that." Pt has screening on 10/5 with [facility] and [facility] referral pending--pt has court dates that may interfere with treatment and is aware of this possibility. Mild withdrawals reported. Poor sleep.   Transportation Means: unknown at this time  Supports: none identified by pt.   Smart, [employer]

## 2015-01-03 NOTE — Progress Notes (Signed)
D: [name] is pleased to be going to [facility]. He reports he was told today he would be discharged tomorrow. States he is ready. Rates his day as pretty good. Still with c/o R shoulder pain. He denies SI/HI. States he has no depression but his anxiety is still high. A: Encouraged patient to continue to utilize everything he has learned in regards to coping skills once he is discharged. R: Continue to monitor for patient safety and medication effectiveness.

## 2015-01-03 NOTE — Clinical Social Work Note (Signed)
CSW met with pt this morning. Pt denies SI/HI/AVH and presents with pleasant mood and calm affect. Pt has been attending and participating in processing/psychoeducational groups and NA/AA meetings throughout his stay at [facility] and continues to work with Nunez and MD in attempt to get accepted into residential treatment program in order to address substance abuse issues. Pt reports that he feels mentally stable and reports minimal depression with no suicidal ideations.   Maxie Better, LCSWA Clinical Social Worker 01/03/2015 11:16 AM

## 2015-01-03 NOTE — Progress Notes (Signed)
  D: Pt was pleasant. Pt complained of rt shoulder pain. Pt spoke to writer in depth related to his pain. Pt didn't speak much about his day or plan. No questions or concerns were discussed.   A:  Support and encouragement was offered. 15 min checks continued for safety.  R: Pt remains safe.

## 2015-01-03 NOTE — Progress Notes (Signed)
D. Pt present with bright and jovial affect in the unit toady. Pt has been observed interacting well with both staff and peers. Pt attended and participated in the group meeting. Pt complained of right shoulder pain he sustained he jumped off two stories  Building. Pt said he learned from this particular incidence and would not try again killing himself. Pt stated is gaol for today is "getting out of here." Pt reported that his depression was a 0, his hopelessness was a 0, and that his anxiety was a 10. Pt reported being negative SI/HI, no AH/VH noted. A: 15 min checks continued for patient safety. R: Pts safety maintained.

## 2015-01-03 NOTE — [facility] Group Notes (Signed)
[facility] LCSW Group Therapy  01/03/2015 12:09 PM  Type of Therapy:  Group Therapy  Participation Level:  Active  Participation Quality:  Attentive  Affect:  Appropriate  Cognitive:  Alert and Oriented  Insight:  Engaged  Engagement in Therapy:  Engaged  Modes of Intervention:  Discussion, Education, Exploration, Limit-setting, Rapport Building, Dance movement psychotherapist, Socialization and Support  Summary of Progress/Problems:  Medical illustrator in Life. Today's group focused on defining balance in one's own words, identifying things that can knock one off balance, and exploring healthy ways to maintain balance in life. Group members were asked to provide an example of a time when they felt off balance, describe how they handled that situation,and process healthier ways to regain balance in the future. Group members were asked to share the most important tool for maintaining balance that they learned while at [facility] and how they plan to apply this method after discharge. [name] was attentive and engaged during today's processing group. He shared that he is reestablishing balance in his life by "facing my court issues" and going to [facility] for help. [name] continues to show progress in the group setting and improving insight.   Smart, Heather LCSWA  01/03/2015, 12:09 PM

## 2015-01-03 NOTE — Progress Notes (Signed)
[facility] MD Progress Note  01/03/2015 10:38 AM [name]  MRN:  [id number] Subjective:  [name] states he is ready to move on. He states he is feeling very mentally stable ready to continue to work on his addiction. He is sleeping better. States that any SI in the past happened when he was intoxicated. He states he needs more help to be sure he does not relapse.  Principal Problem: Substance induced mood disorder Diagnosis:   Patient Active Problem List   Diagnosis Date Noted  . Substance induced mood disorder [F19.94] 12/30/2014  . Depressed [F32.9] 12/29/2014  . Acute upper GI hemorrhage [K92.2] 12/28/2014  . Depression [F32.9] 12/28/2014  . Suicidal ideation [R45.851] 12/28/2014  . Esophagitis [K20.9] 12/28/2014  . Tobacco abuse [Z72.0] 12/28/2014  . Hematemesis [K92.0] 12/27/2014  . Peripheral neuropathy [G62.9] 12/27/2014  . Polysubstance abuse [F19.10] 12/27/2014   Total Time spent with patient: 30 minutes  Past Psychiatric History: see Admission H and P  Past Medical History:  Past Medical History  Diagnosis Date  . Testicular torsion   . Alcohol abuse     Past Surgical History  Procedure Laterality Date  . Right arm surgery    . Surgery scrotal / testicular    . Esophagogastroduodenoscopy N/A 12/28/2014    Procedure: ESOPHAGOGASTRODUODENOSCOPY (EGD);  Surgeon: Jeani Hawking, MD;  Location: [facility];  Service: Endoscopy;  Laterality: N/A;   Family History: History reviewed. No pertinent family history. Family Psychiatric  History: See Admission H and P Social History:  History  Alcohol Use  . Yes    Comment: last drink 12/20/2014     History  Drug Use  . Yes  . Special: Marijuana    Social History   Social History  . Marital Status: Married    Spouse Name: N/A  . Number of Children: N/A  . Years of Education: N/A   Social History Main Topics  . Smoking status: Current Every Day Smoker    Types: Cigarettes  . Smokeless tobacco: Never Used  . Alcohol Use:  Yes     Comment: last drink 12/20/2014  . Drug Use: Yes    Special: Marijuana  . Sexual Activity: Yes   Other Topics Concern  . None   Social History Narrative   Additional Social History:                         Sleep: Fair  Appetite:  Fair  Current Medications: Current Facility-Administered Medications  Medication Dose Route Frequency Provider Last Rate Last Dose  . acetaminophen (TYLENOL) tablet 650 mg  650 mg Oral Q6H PRN Leata Mouse, MD   650 mg at 01/03/15 0134  . alum & mag hydroxide-simeth (MAALOX/MYLANTA) 200-200-20 MG/5ML suspension 30 mL  30 mL Oral Q4H PRN Leata Mouse, MD   30 mL at 01/01/15 0004  . buPROPion (WELLBUTRIN XL) 24 hr tablet 300 mg  300 mg Oral Daily Leata Mouse, MD   300 mg at 01/03/15 0827  . feeding supplement (ENSURE ENLIVE) (ENSURE ENLIVE) liquid 237 mL  237 mL Oral TID BM Rachael Fee, MD   237 mL at 01/03/15 0946  . gabapentin (NEURONTIN) capsule 600 mg  600 mg Oral TID Rachael Fee, MD   600 mg at 01/03/15 4098  . gabapentin (NEURONTIN) capsule 600 mg  600 mg Oral QHS Rachael Fee, MD   600 mg at 01/02/15 2119  . magnesium hydroxide (MILK OF MAGNESIA) suspension 30 mL  30 mL Oral Daily PRN Leata Mouse, MD      . methocarbamol (ROBAXIN) tablet 750 mg  750 mg Oral Q8H PRN Rachael Fee, MD   750 mg at 01/03/15 0829  . mirtazapine (REMERON) tablet 30 mg  30 mg Oral QHS Rachael Fee, MD   30 mg at 01/02/15 2119  . nicotine polacrilex (NICORETTE) gum 2 mg  2 mg Oral PRN Rachael Fee, MD   2 mg at 01/03/15 1610  . pantoprazole (PROTONIX) EC tablet 40 mg  40 mg Oral BID AC Leata Mouse, MD   40 mg at 01/03/15 0640  . QUEtiapine (SEROQUEL) tablet 150 mg  150 mg Oral TID Rachael Fee, MD   150 mg at 01/03/15 9604  . QUEtiapine (SEROQUEL) tablet 200 mg  200 mg Oral QHS Worthy Flank, NP   200 mg at 01/02/15 2119  . traMADol (ULTRAM) tablet 50 mg  50 mg Oral Q6H PRN Rachael Fee, MD    50 mg at 01/03/15 0134    Lab Results: No results found for this or any previous visit (from the past 48 hour(s)).  Physical Findings: AIMS: Facial and Oral Movements Muscles of Facial Expression: None, normal Lips and Perioral Area: None, normal Jaw: None, normal Tongue: None, normal,Extremity Movements Upper (arms, wrists, hands, fingers): None, normal Lower (legs, knees, ankles, toes): None, normal, Trunk Movements Neck, shoulders, hips: None, normal, Overall Severity Severity of abnormal movements (highest score from questions above): None, normal Incapacitation due to abnormal movements: None, normal Patient's awareness of abnormal movements (rate only patient's report): No Awareness, Dental Status Current problems with teeth and/or dentures?: No Does patient usually wear dentures?: No  CIWA:    COWS:     Musculoskeletal: Strength & Muscle Tone: within normal limits Gait & Station: normal Patient leans: normal  Psychiatric Specialty Exam: Review of Systems  Constitutional: Negative.   HENT: Negative.   Eyes: Negative.   Respiratory: Negative.   Cardiovascular: Negative.   Gastrointestinal: Negative.   Genitourinary: Negative.   Musculoskeletal: Positive for joint pain.  Skin: Negative.   Neurological: Negative.   Endo/Heme/Allergies: Negative.   Psychiatric/Behavioral: Positive for substance abuse.    Blood pressure 122/86, pulse 100, temperature 97.3 F (36.3 C), temperature source Oral, resp. rate 16, height  (1.702 m), weight 71.668 kg (158 lb), SpO2 97 %.Body mass index is 24.74 kg/(m^2).  General Appearance: Fairly Groomed  Patent attorney::  Fair  Speech:  Clear and Coherent  Volume:  Normal  Mood:  Euthymic  Affect:  Appropriate  Thought Process:  Coherent and Goal Directed  Orientation:  Full (Time, Place, and Person)  Thought Content:  plans as he moves on, committent to abstinence  Suicidal Thoughts:  No  Homicidal Thoughts:  No  Memory:   Immediate;   Fair Recent;   Fair Remote;   Fair  Judgement:  Fair  Insight:  Present  Psychomotor Activity:  Normal  Concentration:  Fair  Recall:  [employer] of Knowledge:Fair  Language: Fair  Akathisia:  No  Handed:  Right  AIMS (if indicated):     Assets:  Desire for Improvement  ADL's:  Intact  Cognition: WNL  Sleep:  Number of Hours: 5.5   Treatment Plan Summary: Daily contact with patient to assess and evaluate symptoms and progress in treatment and Medication management Supportive approach/coping skills Polysubstance Dependence; continue to work a relapse prevention plan LUGO,IRVING A 01/03/2015, 10:38 AM

## 2015-01-03 NOTE — Progress Notes (Signed)
Pt attended karaoke group this evening.  

## 2015-01-03 NOTE — [facility] Group Notes (Signed)
[facility] Group Notes:  (Nursing/MHT/Case Management/Adjunct)  Date:  01/03/2015  Time:  4:30 PM Type of Therapy:  Psychoeducational Skills  Participation Level:  Active  Participation Quality:  Appropriate  Affect:  Appropriate  Cognitive:  Appropriate  Insight:  Appropriate  Engagement in Group:  Engaged  Modes of Intervention:  Problem-solving   Summary of Progress/Problems: Pt stated not doing drugs a way of life style changes.  :Bethann Punches 01/03/2015, 4:30 PM

## 2015-01-03 NOTE — Clinical Social Work Note (Signed)
CSW denied at [facility] due to recent SI with attempt. CSW called back and spoke with [place] and Melissa in admissions. Faxed recent progress note from CSW and MD stating that pt ahs been attending and participating in groups, and is not endorsing SI/HI/AVH. CSW verified fax was received. [facility] is reconsidering patient and will call back this afternoon with decision.  Trula Slade, LCSWA Clinical Social Worker 01/03/2015 11:20 AM

## 2015-01-04 MED ORDER — NICOTINE POLACRILEX 2 MG MT GUM: 2.0000 mg | CHEWING_GUM | OROMUCOSAL | Status: AC | PRN

## 2015-01-04 MED ORDER — GABAPENTIN 400 MG PO CAPS: 800.0000 mg | ORAL_CAPSULE | Freq: Three times a day (TID) | ORAL | Status: AC

## 2015-01-04 MED ORDER — PANTOPRAZOLE SODIUM 40 MG PO TBEC: 40.0000 mg | DELAYED_RELEASE_TABLET | Freq: Every day | ORAL | Status: AC

## 2015-01-04 MED ORDER — BUPROPION HCL ER (XL) 300 MG PO TB24: 300.0000 mg | ORAL_TABLET | Freq: Every day | ORAL | Status: AC

## 2015-01-04 MED ORDER — GABAPENTIN 400 MG PO CAPS
800.0000 mg | ORAL_CAPSULE | Freq: Every day | ORAL | Status: DC
  Filled 2015-01-04 (×2): qty 2

## 2015-01-04 MED ORDER — QUETIAPINE FUMARATE 200 MG PO TABS: 200.0000 mg | ORAL_TABLET | Freq: Three times a day (TID) | ORAL | Status: AC

## 2015-01-04 MED ORDER — QUETIAPINE FUMARATE 200 MG PO TABS
200.0000 mg | ORAL_TABLET | Freq: Three times a day (TID) | ORAL | Status: DC
  Administered 2015-01-04: 200 mg via ORAL
  Filled 2015-01-04: qty 56
  Filled 2015-01-04 (×3): qty 1
  Filled 2015-01-04 (×2): qty 56

## 2015-01-04 MED ORDER — PANTOPRAZOLE SODIUM 40 MG PO TBEC
40.0000 mg | DELAYED_RELEASE_TABLET | Freq: Every day | ORAL | Status: DC
  Filled 2015-01-04 (×2): qty 14

## 2015-01-04 MED ORDER — MIRTAZAPINE 30 MG PO TABS: 30.0000 mg | ORAL_TABLET | Freq: Every day | ORAL | Status: AC

## 2015-01-04 MED ORDER — GABAPENTIN 400 MG PO CAPS
800.0000 mg | ORAL_CAPSULE | Freq: Three times a day (TID) | ORAL | Status: DC
  Administered 2015-01-04: 800 mg via ORAL
  Filled 2015-01-04: qty 2
  Filled 2015-01-04: qty 112
  Filled 2015-01-04: qty 2
  Filled 2015-01-04 (×2): qty 112
  Filled 2015-01-04 (×2): qty 2

## 2015-01-04 MED ORDER — METHOCARBAMOL 750 MG PO TABS: 750.0000 mg | ORAL_TABLET | Freq: Three times a day (TID) | ORAL | Status: AC | PRN

## 2015-01-04 MED ORDER — TRAMADOL HCL 50 MG PO TABS: 50.0000 mg | ORAL_TABLET | Freq: Four times a day (QID) | ORAL | Status: AC | PRN

## 2015-01-04 NOTE — Discharge Summary (Signed)
Physician Discharge Summary Note  Patient:  [name] is an 34 y.o., male MRN:  [id number] DOB:  September 30, 1980 Patient phone:  [phone] (home)  Patient address:   [address]  [place] [address],  Total Time spent with patient: 45 minutes  Date of Admission:  12/29/2014 Date of Discharge: 01/04/2015  Reason for Admission:   History of Present Illness: [name] is a 34 y.o. male admitted from [facility] after medically cleared for hematemesis and started protonix by medical team. Patient has no more bleeding but understand that he has esophagitis and hernia as EGD completed on 12/28/14. Patient continue to report depression, status post suicide attempt of jumping from two storied balcony and has shoulder pain. He has denied current suicide ideation but continue to report depression, anxiety and denied craving for drugs at this time. He is willing to take medication and wishes to find a place like caring services when discharged from hospital. He was initially presented to [facility] where he was hospitalized for depression and suicidal ideations for past few days prior to [facility] admission.   Principal Problem: Substance induced mood disorder Discharge Diagnoses: Patient Active Problem List   Diagnosis Date Noted  . Substance induced mood disorder [F19.94] 12/30/2014  . Depressed [F32.9] 12/29/2014  . Acute upper GI hemorrhage [K92.2] 12/28/2014  . Depression [F32.9] 12/28/2014  . Suicidal ideation [R45.851] 12/28/2014  . Esophagitis [K20.9] 12/28/2014  . Tobacco abuse [Z72.0] 12/28/2014  . Hematemesis [K92.0] 12/27/2014  . Peripheral neuropathy [G62.9] 12/27/2014  . Polysubstance abuse [F19.10] 12/27/2014    Musculoskeletal: Strength & Muscle Tone: within normal limits Gait & Station: normal Patient leans: N/A  Psychiatric Specialty Exam: Physical Exam  Review of Systems  Psychiatric/Behavioral: Positive for depression and substance abuse. Negative for suicidal ideas and hallucinations.  The patient is nervous/anxious and has insomnia.   All other systems reviewed and are negative.   Blood pressure 124/92, pulse 99, temperature 98.5 F (36.9 C), temperature source Oral, resp. rate 18, height  (1.702 m), weight 71.668 kg (158 lb), SpO2 97 %.Body mass index is 24.74 kg/(m^2).  SEE MD PSE within the SRA   Have you used any form of tobacco in the last 30 days? (Cigarettes, Smokeless Tobacco, Cigars, and/or Pipes): Yes  Has this patient used any form of tobacco in the last 30 days? (Cigarettes, Smokeless Tobacco, Cigars, and/or Pipes) Yes, A prescription for an FDA-approved tobacco cessation medication was offered at discharge and the patient accepted   Past Medical History:  Past Medical History  Diagnosis Date  . Testicular torsion   . Alcohol abuse     Past Surgical History  Procedure Laterality Date  . Right arm surgery    . Surgery scrotal / testicular    . Esophagogastroduodenoscopy N/A 12/28/2014    Procedure: ESOPHAGOGASTRODUODENOSCOPY (EGD);  Surgeon: Jeani Hawking, MD;  Location: [facility];  Service: Endoscopy;  Laterality: N/A;   Family History: History reviewed. No pertinent family history. Social History:  History  Alcohol Use  . Yes    Comment: last drink 12/20/2014     History  Drug Use  . Yes  . Special: Marijuana    Social History   Social History  . Marital Status: Married    Spouse Name: N/A  . Number of Children: N/A  . Years of Education: N/A   Social History Main Topics  . Smoking status: Current Every Day Smoker    Types: Cigarettes  . Smokeless tobacco: Never Used  . Alcohol Use:  Yes     Comment: last drink 12/20/2014  . Drug Use: Yes    Special: Marijuana  . Sexual Activity: Yes   Other Topics Concern  . None   Social History Narrative     Risk to Self: Is patient at risk for suicide?: Yes What has been your use of drugs/alcohol within the last 12 months?: alcohol use-1/2 gallon vodka since Feb 2nd. Marijuana use  daily.  Risk to Others:   Prior Inpatient Therapy:   Prior Outpatient Therapy:    Level of Care:  OP  Hospital Course:   Lamine Laton was admitted for Substance induced mood disorder, and crisis management.  Pt was treated discharged with the medications listed below under Medication List.  Medical problems were identified and treated as needed.  Home medications were restarted as appropriate.  Improvement was monitored by observation and [name] 's daily report of symptom reduction.  Emotional and mental status was monitored by daily self-inventory reports completed by [name] and clinical staff.         [name] was evaluated by the treatment team for stability and plans for continued recovery upon discharge. [name] 's motivation was an integral factor for scheduling further treatment. Employment, transportation, bed availability, health status, family support, and any pending legal issues were also considered during hospital stay. Pt was offered further treatment options upon discharge including but not limited to Residential, Intensive Outpatient, and Outpatient treatment.  [name] will follow up with the services as listed below under Follow Up Information.     Upon completion of this admission the patient was both mentally and medically stable for discharge denying suicidal/homicidal ideation, auditory/visual/tactile hallucinations, delusional thoughts and paranoia.    Consults:  None  Significant Diagnostic Studies:   UDS + for [facility]  Discharge Vitals:   Blood pressure 124/92, pulse 99, temperature 98.5 F (36.9 C), temperature source Oral, resp. rate 18, height  (1.702 m), weight 71.668 kg (158 lb), SpO2 97 %. Body mass index is 24.74 kg/(m^2). Lab Results:   No results found for this or any previous visit (from the past 72 hour(s)).  Physical Findings: AIMS: Facial and Oral Movements Muscles of Facial Expression: None, normal Lips and Perioral Area: None,  normal Jaw: None, normal Tongue: None, normal,Extremity Movements Upper (arms, wrists, hands, fingers): None, normal Lower (legs, knees, ankles, toes): None, normal, Trunk Movements Neck, shoulders, hips: None, normal, Overall Severity Severity of abnormal movements (highest score from questions above): None, normal Incapacitation due to abnormal movements: None, normal Patient's awareness of abnormal movements (rate only patient's report): No Awareness, Dental Status Current problems with teeth and/or dentures?: No Does patient usually wear dentures?: No  CIWA:  CIWA-Ar Total: 3 COWS:      See Psychiatric Specialty Exam and Suicide Risk Assessment completed by Attending Physician prior to discharge.  Discharge destination:  [facility]  Is patient on multiple antipsychotic therapies at discharge:  No   Has Patient had three or more failed trials of antipsychotic monotherapy by history:  No    Recommended Plan for Multiple Antipsychotic Therapies: NA     Medication List    STOP taking these medications        traZODone 50 MG tablet  Commonly known as:  DESYREL      TAKE these medications      Indication   buPROPion 300 MG 24 hr tablet  Commonly known as:  WELLBUTRIN XL  Take 1 tablet (300 mg total) by  mouth daily.   Indication:  Major Depressive Disorder     gabapentin 400 MG capsule  Commonly known as:  NEURONTIN  Take 2 capsules (800 mg total) by mouth 4 (four) times daily -  before meals and at bedtime.   Indication:  mood stabilization     methocarbamol 750 MG tablet  Commonly known as:  ROBAXIN  Take 1 tablet (750 mg total) by mouth every 8 (eight) hours as needed for muscle spasms.   Indication:  Musculoskeletal Pain     mirtazapine 30 MG tablet  Commonly known as:  REMERON  Take 1 tablet (30 mg total) by mouth at bedtime.   Indication:  Trouble Sleeping     nicotine polacrilex 2 MG gum  Commonly known as:  NICORETTE  Take 1 each (2 mg total) by mouth as  needed for smoking cessation.   Indication:  Nicotine Addiction     pantoprazole 40 MG tablet  Commonly known as:  PROTONIX  Take 1 tablet (40 mg total) by mouth daily.   Indication:  Gastroesophageal Reflux Disease     QUEtiapine 200 MG tablet  Commonly known as:  SEROQUEL  Take 1 tablet (200 mg total) by mouth 4 (four) times daily -  before meals and at bedtime.   Indication:  mood stabilization     traMADol 50 MG tablet  Commonly known as:  ULTRAM  Take 1 tablet (50 mg total) by mouth every 6 (six) hours as needed for moderate pain.   Indication:  Moderate to Moderately Severe Pain           Follow-up Information    Follow up with [facility] On 01/04/2015.   Why:  You have been accepted for treatment on this date and transportation will pick you up at 1:00PM. Thank you.    Contact information:   [address] [place], [place] [address] Phone: [phone] Fax: [phone]      Follow-up recommendations:  Activity:  As tolerated Diet:  heart healthy with low sodium  Comments:  Take all medications as prescribed. Keep all follow-up appointments as scheduled.  Do not consume alcohol or use illegal drugs while on prescription medications. Report any adverse effects from your medications to your primary care Jullisa Grigoryan promptly.  In the event of recurrent symptoms or worsening symptoms, call 911, a crisis hotline, or go to the nearest emergency department for evaluation.   Total Discharge Time: Greater than 30 minutes  Signed: Beau Fanny, FNP-BC  01/04/2015, 11:22 AM  I personally assessed the patient and formulated the plan Madie Reno A. Dub Mikes, M.D.

## 2015-01-04 NOTE — Progress Notes (Signed)
  [facility] Adult Case Management Discharge Plan :  Will you be returning to the same living situation after discharge:  No.Pt accepted to [facility] today at 1pm At discharge, do you have transportation home?: Yes,  [facility] transporation will arrive at 1pm to pick up pt. Do you have the ability to pay for your medications: Yes,  mental health  Release of information consent forms completed and submitted to medical records by CSW.   Patient to Follow up at: Follow-up Information    Follow up with [facility] On 01/04/2015.   Why:  You have been accepted for treatment on this date and transportation will pick you up at 1:00PM. Thank you.    Contact information:   [address] [place], [place] [address] Phone: [phone] Fax: [phone]      Patient denies SI/HI: Yes,  during group/self report.     Safety Planning and Suicide Prevention discussed: Yes,  SPE completed with pt, as he refused to consent to family contact. SPI pamphlet provided to pt and he was provided with mobile crisis information  Have you used any form of tobacco in the last 30 days? (Cigarettes, Smokeless Tobacco, Cigars, and/or Pipes): Yes  Has patient been referred to the Quitline?: Patient refused referral  Smart, Lebron Quam  01/04/2015, 10:45 AM

## 2015-01-04 NOTE — [facility]HH Suicide Risk Assessment (Signed)
[facility] Discharge Suicide Risk Assessment   Demographic Factors:  Male and Caucasian  Total Time spent with patient: 30 minutes  Musculoskeletal: Strength & Muscle Tone: within normal limits Gait & Station: normal Patient leans: normal  Psychiatric Specialty Exam: Physical Exam  Review of Systems  Constitutional: Negative.   HENT: Negative.   Eyes: Negative.   Respiratory: Negative.   Cardiovascular: Negative.   Gastrointestinal: Negative.   Genitourinary: Negative.   Musculoskeletal: Positive for joint pain.  Skin: Negative.   Neurological: Negative.   Endo/Heme/Allergies: Negative.   Psychiatric/Behavioral: Positive for substance abuse.    Blood pressure 124/92, pulse 99, temperature 98.5 F (36.9 C), temperature source Oral, resp. rate 18, height  (1.702 m), weight 71.668 kg (158 lb), SpO2 97 %.Body mass index is 24.74 kg/(m^2).  General Appearance: Fairly Groomed  Patent attorney::  Fair  Speech:  Clear and Coherent409  Volume:  Normal  Mood:  Euthymic  Affect:  Appropriate  Thought Process:  Coherent and Goal Directed  Orientation:  Full (Time, Place, and Person)  Thought Content:  plans as he moves on, relapse prevention plan  Suicidal Thoughts:  No  Homicidal Thoughts:  No  Memory:  Immediate;   Fair Recent;   Fair Remote;   Fair  Judgement:  Fair  Insight:  Present  Psychomotor Activity:  Normal  Concentration:  Fair  Recall:  [employer] of Knowledge:Fair  Language: Fair  Akathisia:  No  Handed:  Right  AIMS (if indicated):     Assets:  Desire for Improvement  Sleep:  Number of Hours: 5.25  Cognition: WNL  ADL's:  Intact   Have you used any form of tobacco in the last 30 days? (Cigarettes, Smokeless Tobacco, Cigars, and/or Pipes): Yes  Has this patient used any form of tobacco in the last 30 days? (Cigarettes, Smokeless Tobacco, Cigars, and/or Pipes) Yes, A prescription for an FDA-approved tobacco cessation medication was offered at discharge and the  patient refused  Mental Status Per Nursing Assessment::   On Admission:     Current Mental Status by Physician: In full contact with reality. There are no active S/S of withdrawal, there are no active SI plans or intent. He is willing and motivated to pursue residential treatment. He will be going to [facility] this afternoon.    Loss Factors: Legal issues  Historical Factors: Impulsivity  Risk Reduction Factors:   Sense of responsibility to family  Continued Clinical Symptoms:  Severe Anxiety and/or Agitation Depression:   Impulsivity Alcohol/Substance Abuse/Dependencies  Cognitive Features That Contribute To Risk:  None    Suicide Risk:  Minimal: No identifiable suicidal ideation.  Patients presenting with no risk factors but with morbid ruminations; may be classified as minimal risk based on the severity of the depressive symptoms  Principal Problem: Substance induced mood disorder Discharge Diagnoses:  Patient Active Problem List   Diagnosis Date Noted  . Substance induced mood disorder [F19.94] 12/30/2014  . Depressed [F32.9] 12/29/2014  . Acute upper GI hemorrhage [K92.2] 12/28/2014  . Depression [F32.9] 12/28/2014  . Suicidal ideation [R45.851] 12/28/2014  . Esophagitis [K20.9] 12/28/2014  . Tobacco abuse [Z72.0] 12/28/2014  . Hematemesis [K92.0] 12/27/2014  . Peripheral neuropathy [G62.9] 12/27/2014  . Polysubstance abuse [F19.10] 12/27/2014    Follow-up Information    Follow up with [facility] On 01/04/2015.   Why:  You have been accepted for treatment on this date and transportation will pick you up at 1:00PM. Thank you.    Contact information:  [address] [place], [place] [address] Phone: [phone] Fax: [phone]      Plan Of Care/Follow-up recommendations:  Activity:  as tolerated Diet:  regular Follow up as above Is patient on multiple antipsychotic therapies at discharge:  No   Has Patient had three or more failed trials of antipsychotic  monotherapy by history:  No  Recommended Plan for Multiple Antipsychotic Therapies: NA    LUGO,IRVING A 01/04/2015, 12:29 PM

## 2015-01-04 NOTE — Tx Team (Signed)
Interdisciplinary Treatment Plan Update (Adult)  Date:  01/04/2015  Time Reviewed:  10:46 AM   Progress in Treatment: Attending groups: Yes  Participating in groups: Yes  Taking medication as prescribed:  Yes. Tolerating medication:  Yes. Family/Significant othe contact made:  SPE completed with pt, as he refused to consent to family contact.  Patient understands diagnosis:  Yes. and As evidenced by:  seeking treatment for SI with recent attempt, depression, alcohol/marijuana abuse, and medication stabilization. Discussing patient identified problems/goals with staff:  Yes. Medical problems stabilized or resolved:  Yes. Denies suicidal/homicidal ideation: Yes. Issues/concerns per patient self-inventory:  Other:  Discharge Plan or Barriers: Pt accepted to [facility] and plans to follow-up with [facility] CSW confirmed this with Clearnce Sorrel. Pt also provided with [employer] information, Shelter list, and [facility] info.   Reason for Continuation of Hospitalization: none  Comments:  [name] is an 34 y.o. male that presents to [facility] self-referred after reporting SI. Pt stated he tried to kill himself by jumping off a two story building last night. Pt stated he still feels suicidal. Pt stated he also feels homicidal "to those that f with my head," and pt would not elaborate on this. Pt reports he hears voices telling him to kill himself. Pt has a hx of trying to cut his wrists "a Nikolov time ago" by report. Pt endorses sx of depression, stating he drinks because he is depressed.Pt is not currently on any medications by report. Pt stated he drinks and smokes marijuana regularly, reporting drinking one half gallon liquor daily, last drank at 5 pm last night, and smokes 3 blunts per day. Pt stated current withdrawal sx include nausea, headache, tremor. Pt stated he is homeless and has no support. Pt stated he has court charges coming up for misdemeanors and  felony charges, but that his court dates are continued for October and November. Pt admits to being in prison before and having a violent hx. Pt was oriented x 3, appeared depressed, had logical/coherent thought processes, good eye contact, normal speech, reported he was in pain from the fall from the roof on his right shoulder, was in scrubs, pleasant. Diagnosis upon admission: Major Depressive Disorder, Recurrent, Moderate, 303.90 Alcohol Use Disorder, Severe, 304.30 Cannabis Use Disorder, Severe  Estimated length of stay:  D/c today   Additional Comments:  Patient and CSW reviewed pt's identified goals and treatment plan. Patient verbalized understanding and agreed to treatment plan. CSW reviewed [facility] "Discharge Process and Patient Involvement" Form. Pt verbalized understanding of information provided and signed form.    Review of initial/current patient goals per problem list:  1. Goal(s): Patient will participate in aftercare plan  Met:yes   Target date: at discharge  As evidenced by: Patient will participate within aftercare plan AEB aftercare provider and housing plan at discharge being identified.  9/26: Pt did not attend morning discharge planning group. CSW assessing for appropriate referrals.   9/30: Pt plans to go to [facility] for admission and will be picked up at 1pm by [facility] driver.   2. Goal (s): Patient will exhibit decreased depressive symptoms and suicidal ideations.  Met:Yes    Target date: at discharge  As evidenced by: Patient will utilize self rating of depression at 3 or below and demonstrate decreased signs of depression or be deemed stable for discharge by MD.  9/26: Pt rates depression as high and presents with depressed mood. Denies SI/HI/AVH.   9/30: Pt rates depression  as 0/10 and presents with pleasant mood and calm affect. He denies SI/HI/AVH.    3. Goal(s): Patient will demonstrate decreased signs of withdrawal due to substance abuse  Met: Yes .    Target date:at discharge   As evidenced by: Patient will produce a CIWA/COWS score of 0, have stable vitals signs, and no symptoms of withdrawal.  9/26: Pt reports mild withdrawal with high pulse. No CIWA score taken today.   9/30: Pt denies withdrawal symptoms today with no COWS/CIWA and high BP. Per MD, pt is medically stable for discharge today.    Attendees: Patient:   01/04/2015 10:46 AM   Family:   01/04/2015 10:46 AM   Physician:  Dr. Carlton Adam, MD 01/04/2015 10:46 AM   Nursing:  Ranae Palms RN 01/04/2015 10:46 AM   Clinical Social Worker: Maxie Better, [place]  01/04/2015 10:46 AM   Clinical Social Worker:  Peri Maris LCSWA 01/04/2015 10:46 AM   Other:  Gerline Legacy Nurse Case Manager 01/04/2015 10:46 AM   Other:  Lucinda Dell; [facility]  01/04/2015 10:46 AM   Other:   01/04/2015 10:46 AM   Other:  01/04/2015 10:46 AM   Other:  01/04/2015 10:46 AM   Other:  01/04/2015 10:46 AM    01/04/2015 10:46 AM    01/04/2015 10:46 AM    01/04/2015 10:46 AM    01/04/2015 10:46 AM    Scribe for Treatment Team:   Maxie Better, [place]  01/04/2015 10:46 AM

## 2015-01-04 NOTE — Progress Notes (Signed)
Nutrition Education Note  Pt attended group focusing on general, healthful nutrition education.  RD emphasized the importance of eating regular meals and snacks throughout the day. Consuming sugar-free beverages and incorporating fruits and vegetables into diet when possible. Provided examples of healthy snacks. Patient encouraged to leave group with a goal to improve nutrition/healthy eating.   Diet Order: Diet regular Room service appropriate?: Yes; Fluid consistency:: Thin Pt is also offered choice of unit snacks mid-morning and mid-afternoon.  Pt is eating as desired.   If additional nutrition issues arise, please consult RD.     Jessica Ostheim, RD, LDN Inpatient Clinical Dietitian Pager # [phone] After hours/weekend pager # [phone]     

## 2015-01-04 NOTE — Progress Notes (Signed)
Discharge note:  Patient discharged per MD order.  Patient received all personal belongings, prescriptions and medication samples.  He denies SI/HI/AVH.  Reviewed all discharge instructions and follow up information.  Patient indicated understanding.  Patient left with [facility] representative.

## 2015-01-31 NOTE — Discharge Summary (Signed)
Physician Discharge Summary Note  Patient:  [name] is an 34 y.o., male MRN:  [id number] DOB:  [birth year] Patient phone:  [phone] (home)  Patient address:   [address]  [place] [id number],  Total Time spent with patient: 30 minutes  Date of Admission:  12/21/2014 Date of Discharge: 12/27/2014  Reason for Admission:   [name] is an 34 y.o. male that [facility] self-referred after reporting SI. Pt stated he tried to kill himself by jumping off a two story building last night. Pt stated he still feels suicidal. Pt stated he also feels homicidal "to those that f with my head," and pt would not elaborate on this. Pt reports he hears voices telling him to kill himself. Pt has a hx of trying to cut his wrists "a [place] time ago" by report. Pt endorses sx of depression, stating he drinks because he is depressed.Pt is not currently on any medications by report. Pt stated he drinks and smokes marijuana regularly, reporting drinking one half gallon liquor daily, last drank at 5 pm last night, and smokes 3 blunts per day. Pt stated current withdrawal sx include nausea, headache, tremor. Pt stated he is homeless and has no support. Pt stated he has court charges coming up for misdemeanors and felony charges, but that his court dates are continued for October and November. Pt admits to being in prison before and having a violent hx. Pt was oriented x 3, appeared depressed, had logical/coherent thought processes, good eye contact, normal speech, reported he was in pain from the fall from the roof on his right shoulder.   Principal Problem: Substance induced mood disorder [facility](HCC) Discharge Diagnoses: Patient Active Problem List   Diagnosis Date Noted  . Substance induced mood disorder (HCC) [F19.94] 12/30/2014  . Depressed [F32.9] 12/29/2014  . Acute upper GI hemorrhage [K92.2] 12/28/2014  . Depression [F32.9] 12/28/2014  . Suicidal ideation [R45.851] 12/28/2014  . Esophagitis  [K20.9] 12/28/2014  . Tobacco abuse [Z72.0] 12/28/2014  . Hematemesis [K92.0] 12/27/2014  . Peripheral neuropathy (HCC) [G62.9] 12/27/2014  . Polysubstance abuse [F19.10] 12/27/2014    Musculoskeletal: Strength & Muscle Tone: within normal limits Gait & Station: normal Patient leans: normal  Psychiatric Specialty Exam: Physical Exam  Review of Systems  Constitutional: Negative.   HENT: Negative.   Eyes: Negative.   Respiratory: Negative.   Cardiovascular: Negative.   Gastrointestinal: Positive for abdominal pain and melena.  Genitourinary: Negative.   Musculoskeletal: Negative.   Skin: Negative.   Neurological: Positive for dizziness.  Endo/Heme/Allergies: Negative.   Psychiatric/Behavioral: Positive for depression and substance abuse.    Blood pressure 145/87, pulse 84, temperature 98.1 F (36.7 C), temperature source Oral, resp. rate 16, height 5\' 6"  (1.676 m), weight 62.143 kg (137 lb), SpO2 98 %.Body mass index is 22.12 kg/(m^2).  General Appearance: Fairly Groomed  Patent attorneyye Contact::  Fair  Speech:  Clear and Coherent  Volume:  Normal  Mood:  Anxious and in pain  Affect:  anxious worried  Thought Process:  Coherent and Goal Directed  Orientation:  Full (Time, Place, and Person)  Thought Content:  symptoms events worries concerns  Suicidal Thoughts:  No  Homicidal Thoughts:  No  Memory:  Immediate;   Fair Recent;   Fair Remote;   Fair  Judgement:  Fair  Insight:  Present  Psychomotor Activity:  Restlessness  Concentration:  Fair  Recall:  FiservFair  Fund of Knowledge:Fair  Language: Fair  Akathisia:  No  Handed:  Right  AIMS (if indicated):     Assets:  Desire for Improvement  ADL's:  Intact  Cognition: WNL  Sleep:  Number of Hours: 4.25   Have you used any form of tobacco in the last 30 days? (Cigarettes, Smokeless Tobacco, Cigars, and/or Pipes): Yes  Has this patient used any form of tobacco in the last 30 days? (Cigarettes, Smokeless Tobacco, Cigars, and/or  Pipes) Yes, A prescription for an FDA-approved tobacco cessation medication was offered at discharge and the patient refused  Past Medical History:  Past Medical History  Diagnosis Date  . Testicular torsion   . Alcohol abuse     Past Surgical History  Procedure Laterality Date  . Right arm surgery    . Surgery scrotal / testicular    . Esophagogastroduodenoscopy N/A 12/28/2014    Procedure: ESOPHAGOGASTRODUODENOSCOPY (EGD);  Surgeon: Jeani Hawking, MD;  Location: [facility];  Service: Endoscopy;  Laterality: N/A;   Family History: History reviewed. No pertinent family history. Social History:  History  Alcohol Use  . Yes    Comment: last drink 12/20/2014     History  Drug Use  . Yes  . Special: Marijuana    Social History   Social History  . Marital Status: Married    Spouse Name: N/A  . Number of Children: N/A  . Years of Education: N/A   Social History Main Topics  . Smoking status: Current Every Day Smoker    Types: Cigarettes  . Smokeless tobacco: Never Used  . Alcohol Use: Yes     Comment: last drink 12/20/2014  . Drug Use: Yes    Special: Marijuana  . Sexual Activity: Yes   Other Topics Concern  . None   Social History Narrative    Past Psychiatric History: Hospitalizations:  Outpatient Care:  Substance Abuse Care:  Self-Mutilation:  Suicidal Attempts:  Violent Behaviors:   Risk to Self: Is patient at risk for suicide?: No ("it's 50/50, sometimes I do, sometimes I don't" denies at this time. ) What has been your use of drugs/alcohol within the last 12 months?: Alcohol and marijuana daily Risk to Others:   Prior Inpatient Therapy:   Prior Outpatient Therapy:    Level of Care:  Medical unit  Hospital Course:  On September 22 he complained of vomiting blood, coffee ground vomit. He was tachycardic. He was sent to the ED. The assessment   at the ED was as follows: Past medical history of chronic right shoulder pain, polysubstance abuse who has  been hospitalized at behavioral health Hospital for depression and suicidal ideations for the past 4 days and then this morning had an episode of large hematemesis. Brought into the emergency room where he was noted to have stable vitals including blood pressure and heart rate, but hemoglobin which had been 14.1 on 9/16 had dropped to 12.6. Patient was complaining of abdominal discomfort. He has been on NSAIDs at home which she takes frequently for his right shoulder. He states he is a previous history of a gastric ulcer Hospitalists were called for further evaluation. He was admitted to the medical unit with plans to transfer back to Behavioral health   Consults:  hospitalist  Significant Diagnostic Studies:  See labs  Discharge Vitals:   Blood pressure 145/87, pulse 84, temperature 98.1 F (36.7 C), temperature source Oral, resp. rate 16, height  (1.676 m), weight 62.143 kg (137 lb), SpO2 98 %. Body mass index is 22.12 kg/(m^2). Lab Results:   No results found for this  or any previous visit (from the past 72 hour(s)).  Physical Findings: AIMS: Facial and Oral Movements Muscles of Facial Expression: None, normal Lips and Perioral Area: None, normal Jaw: None, normal Tongue: None, normal,Extremity Movements Upper (arms, wrists, hands, fingers): None, normal Lower (legs, knees, ankles, toes): None, normal, Trunk Movements Neck, shoulders, hips: None, normal, Overall Severity Severity of abnormal movements (highest score from questions above): None, normal Incapacitation due to abnormal movements: None, normal Patient's awareness of abnormal movements (rate only patient's report): No Awareness, Dental Status Current problems with teeth and/or dentures?: No Does patient usually wear dentures?: No  CIWA:  CIWA-Ar Total: 1 COWS:      See Psychiatric Specialty Exam and Suicide Risk Assessment completed by Attending Physician prior to discharge.  Discharge destination:  Other:   [facility]  Is patient on multiple antipsychotic therapies at discharge:  No   Has Patient had three or more failed trials of antipsychotic monotherapy by history:  No    Recommended Plan for Multiple Antipsychotic Therapies: NA     Medication List    STOP taking these medications        ibuprofen 400 MG tablet  Commonly known as:  ADVIL,MOTRIN         Follow-up recommendations:  Back to Behavioral Health to complete treatment once D/C from [facility]  Comments:  Should continue same meds  Total Discharge Time: 20  Signed: LUGO,IRVING A 01/31/2015, 10:06 AM

## 2015-02-05 DEATH — deceased
